# Patient Record
Sex: Female | Born: 1995 | Hispanic: No | Marital: Single | Smoking: Never smoker
Health system: Southern US, Community
[De-identification: ages and names within clinical notes are randomized; demographics above are authoritative.]

## PROBLEM LIST (undated history)

## (undated) DIAGNOSIS — N750 Cyst of Bartholin's gland: Secondary | ICD-10-CM

## (undated) HISTORY — PX: DENTAL SURGERY: SHX609

---

## 2015-06-20 ENCOUNTER — Emergency Department (INDEPENDENT_AMBULATORY_CARE_PROVIDER_SITE_OTHER)
Admission: EM | Admit: 2015-06-20 | Discharge: 2015-06-20 | Disposition: A | Discharge status: Home / Self Care | Source: Home / Self Care | Attending: Emergency Medicine | Admitting: Emergency Medicine

## 2015-06-20 ENCOUNTER — Encounter (HOSPITAL_COMMUNITY): Payer: Self-pay | Admitting: *Deleted

## 2015-06-20 ENCOUNTER — Other Ambulatory Visit (HOSPITAL_COMMUNITY)
Admission: RE | Admit: 2015-06-20 | Discharge: 2015-06-20 | Disposition: A | Discharge status: Home / Self Care | Source: Ambulatory Visit | Attending: Emergency Medicine | Admitting: Emergency Medicine

## 2015-06-20 DIAGNOSIS — N751 Abscess of Bartholin's gland: Secondary | ICD-10-CM

## 2015-06-20 DIAGNOSIS — N750 Cyst of Bartholin's gland: Secondary | ICD-10-CM | POA: Insufficient documentation

## 2015-06-20 MED ORDER — TRAMADOL HCL 50 MG PO TABS: 50.0000 mg | ORAL_TABLET | Freq: Four times a day (QID) | ORAL | Status: DC | PRN

## 2015-06-20 MED ORDER — IBUPROFEN 800 MG PO TABS: 800.0000 mg | ORAL_TABLET | Freq: Three times a day (TID) | ORAL | Status: DC

## 2015-06-20 MED ORDER — SULFAMETHOXAZOLE-TRIMETHOPRIM 800-160 MG PO TABS: 2.0000 | ORAL_TABLET | Freq: Two times a day (BID) | ORAL | Status: DC

## 2015-06-20 NOTE — ED Provider Notes (Signed)
HPI  SUBJECTIVE:  Teresa Braun is a 20 y.o. female who presents with painful swelling of her right labia starting yesterday. 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 Was treated for this last year with a Ward catheter and Bactrim, and improved. She states that it started draining earlier today, and that it has decreased in size somewhat. Symptoms better with warm compresses, worse with palpation, movement, walking. She has also tried sitz baths. She has not taken any medications for this. No itching, burning, nausea, vomiting, fevers, vaginal bleeding, vaginal discharge, genital rash. She is in a monogamous sexual relationship with a female, who is asymptomatic. STDs are not a concern today. Past medical history negative for gonorrhea, chlamydia, HIV, HSV, syphilis, BV, Trichomonas, yeast, diabetes. LMP last week, denies possibility of being pregnant. PMD: None.    History reviewed. No pertinent past medical history.  History reviewed. No pertinent past surgical history.  History reviewed. No pertinent family history.  Social History  Substance Use Topics  . Smoking status: Never Smoker   . Smokeless tobacco: None  . Alcohol Use: No    No current facility-administered medications for this encounter.  Current outpatient prescriptions:  .  ibuprofen (ADVIL,MOTRIN) 800 MG tablet, Take 1 tablet (800 mg total) by mouth 3 (three) times daily., Disp: 30 tablet, Rfl: 0 .  sulfamethoxazole-trimethoprim (BACTRIM DS,SEPTRA DS) 800-160 MG tablet, Take 2 tablets by mouth 2 (two) times daily., Disp: 40 tablet, Rfl: 0 .  traMADol (ULTRAM) 50 MG tablet, Take 1 tablet (50 mg total) by mouth every 6 (six) hours as needed., Disp: 15 tablet, Rfl: 0  No Known Allergies   ROS  As noted in HPI.   Physical Exam  BP 113/78 mmHg  Pulse 82  Temp(Src) 98 F (36.7 C) (Oral)  Resp 16  SpO2 99%  LMP  05/29/2015  Constitutional: Well developed, well nourished, no acute distress Eyes:  EOMI, conjunctiva normal bilaterally HENT: Normocephalic, atraumatic,mucus membranes moist Respiratory: Normal inspiratory effort Cardiovascular: Normal rate GI: nondistended GU: Tender swelling inferior medial right labia consistent with a Bartholin's gland abscess. Positive expressible yellowish clear drainage. No other genital rash, vaginal discharge. Patient declined chaperone. skin: No rash, skin intact Musculoskeletal: no deformities Neurologic: Alert & oriented x 3, no focal neuro deficits Psychiatric: Speech and behavior appropriate   ED Course   Medications - No data to display  Orders Placed This Encounter  Procedures  . Culture, routine-abscess    Right labia/bartholin gland    Standing Status: Standing     Number of Occurrences: 1     Standing Expiration Date:     Order Specific Question:  Patient immune status    Answer:  Normal    No results found for this or any previous visit (from the past 24 hour(s)). No results found.  ED Clinical Impression  Bartholin gland cyst  Bartholin's gland abscess   ED Assessment/Plan  Is already draining on exam, will not I&D today. patient states that it has become significantly smaller since this morning. Obtained some drainage for culture to confirm antibiotic choice. We'll have her continue warm sitz baths, warm compresses, home with Bactrim, patient states that this has worked well for her in the past, pain medicine, OB/GYN referral. Discussed medical decision-making, plan and follow-up, patient agrees with plan.  *This clinic note was created using Dragon dictation software. Therefore, there may be occasional mistakes despite careful proofreading.  ?  Domenick Gong, MD 06/20/15 2100

## 2015-06-20 NOTE — Discharge Instructions (Signed)
Follow-up with one of the OB/GYN providers listed to have this definitively taken care of. Continue sitz baths, warm compresses. Make sure you finish all of the antibiotics. Take the pain medicine as needed for pain. Return to the ED for fevers above 100.4, pain not controlled medications or other concerns

## 2015-06-20 NOTE — ED Notes (Signed)
Pt states  She  Has  A  Recurring  Bartholin  Cyst     That  Has  Become  Inflamed    X  2  Days     She reports          She  Has  Had  Numerous    Cysts  Before       she  Reports  The  Symptoms    Not  releived  By warm compresses   And  sitzbaths

## 2015-06-23 LAB — CULTURE, ROUTINE-ABSCESS
CULTURE: NO GROWTH
GRAM STAIN: NONE SEEN

## 2015-06-28 ENCOUNTER — Telehealth (HOSPITAL_COMMUNITY): Payer: Self-pay | Admitting: Emergency Medicine

## 2015-06-28 NOTE — ED Notes (Signed)
Called pt and notified of recent lab results from visit 3/10 Pt ID'd properly... Reports feeling better and sx have subsided  Per Dr. Dayton ScrapeMurray,  Please let patient know that abscess culture is negative for MRSA. Finish rx bactrim (trimethoprim/sulfamethoxazole). Recheck for increasing redness/swelling/drainage/pain. LM  Adv pt if sx are not getting better to return  Pt verb understanding

## 2015-10-11 ENCOUNTER — Emergency Department (HOSPITAL_COMMUNITY)
Admission: EM | Admit: 2015-10-11 | Discharge: 2015-10-11 | Disposition: A | Discharge status: Home / Self Care | Attending: Emergency Medicine | Admitting: Emergency Medicine

## 2015-10-11 ENCOUNTER — Encounter (HOSPITAL_COMMUNITY): Payer: Self-pay | Admitting: Emergency Medicine

## 2015-10-11 DIAGNOSIS — R102 Pelvic and perineal pain: Secondary | ICD-10-CM | POA: Diagnosis present

## 2015-10-11 DIAGNOSIS — N751 Abscess of Bartholin's gland: Secondary | ICD-10-CM | POA: Insufficient documentation

## 2015-10-11 DIAGNOSIS — Z792 Long term (current) use of antibiotics: Secondary | ICD-10-CM | POA: Insufficient documentation

## 2015-10-11 HISTORY — DX: Cyst of Bartholin's gland: N75.0

## 2015-10-11 LAB — POC URINE PREG, ED: PREG TEST UR: NEGATIVE

## 2015-10-11 LAB — URINE MICROSCOPIC-ADD ON

## 2015-10-11 LAB — URINALYSIS, ROUTINE W REFLEX MICROSCOPIC
BILIRUBIN URINE: NEGATIVE
Glucose, UA: NEGATIVE mg/dL
KETONES UR: NEGATIVE mg/dL
NITRITE: NEGATIVE
Protein, ur: NEGATIVE mg/dL
SPECIFIC GRAVITY, URINE: 1.01 (ref 1.005–1.030)
pH: 8 (ref 5.0–8.0)

## 2015-10-11 LAB — COMPREHENSIVE METABOLIC PANEL
ALK PHOS: 55 U/L (ref 38–126)
ALT: 16 U/L (ref 14–54)
ANION GAP: 8 (ref 5–15)
AST: 21 U/L (ref 15–41)
Albumin: 4.5 g/dL (ref 3.5–5.0)
BILIRUBIN TOTAL: 0.7 mg/dL (ref 0.3–1.2)
BUN: 7 mg/dL (ref 6–20)
CALCIUM: 9.1 mg/dL (ref 8.9–10.3)
CO2: 24 mmol/L (ref 22–32)
Chloride: 105 mmol/L (ref 101–111)
Creatinine, Ser: 0.72 mg/dL (ref 0.44–1.00)
GLUCOSE: 103 mg/dL — AB (ref 65–99)
Potassium: 4.1 mmol/L (ref 3.5–5.1)
Sodium: 137 mmol/L (ref 135–145)
TOTAL PROTEIN: 7.7 g/dL (ref 6.5–8.1)

## 2015-10-11 LAB — LIPASE, BLOOD: Lipase: 29 U/L (ref 11–51)

## 2015-10-11 LAB — CBC
HCT: 38 % (ref 36.0–46.0)
HEMOGLOBIN: 13 g/dL (ref 12.0–15.0)
MCH: 29.5 pg (ref 26.0–34.0)
MCHC: 34.2 g/dL (ref 30.0–36.0)
MCV: 86.4 fL (ref 78.0–100.0)
Platelets: 137 10*3/uL — ABNORMAL LOW (ref 150–400)
RBC: 4.4 MIL/uL (ref 3.87–5.11)
RDW: 12.9 % (ref 11.5–15.5)
WBC: 12.8 10*3/uL — ABNORMAL HIGH (ref 4.0–10.5)

## 2015-10-11 MED ORDER — ONDANSETRON 4 MG PO TBDP: 4.0000 mg | ORAL_TABLET | Freq: Three times a day (TID) | ORAL | Status: DC | PRN

## 2015-10-11 MED ORDER — DOXYCYCLINE HYCLATE 100 MG PO CAPS: 100.0000 mg | ORAL_CAPSULE | Freq: Two times a day (BID) | ORAL | Status: DC

## 2015-10-11 MED ORDER — DOXYCYCLINE HYCLATE 100 MG PO TABS
100.0000 mg | ORAL_TABLET | Freq: Once | ORAL | Status: AC
  Administered 2015-10-11: 100 mg via ORAL
  Filled 2015-10-11: qty 1

## 2015-10-11 NOTE — Discharge Instructions (Signed)
Bartholin Cyst or Abscess A Bartholin cyst is a fluid-filled sac that forms on a Bartholin gland. Bartholin glands are small glands that are located within the folds of skin (labia) along the sides of the lower opening of the vagina. These glands produce a fluid to moisten the outside of the vagina during sexual intercourse. A Bartholin cyst causes a bulge on the side of the vagina. A cyst that is not large or infected may not cause symptoms or problems. However, if the fluid within the cyst becomes infected, the cyst can turn into an abscess. An abscess may cause discomfort or pain. CAUSES A Bartholin cyst may develop when the duct of the gland becomes blocked. In many cases, the cause of this is not known. Various kinds of bacteria can cause the cyst to become infected and develop into an abscess. RISK FACTORS You may be at an increased risk of developing a Bartholin cyst or abscess if:  You are a woman of reproductive age.  You have a history of previous Bartholin cysts or abscesses.  You have diabetes.  You have a sexually transmitted disease (STD). SIGNS AND SYMPTOMS The severity of symptoms varies depending on the size of the cyst and whether it is infected. Symptoms may include:  A bulge or swelling near the lower opening of your vagina.  Discomfort or pain.  Redness.  Pain during sexual intercourse.  Pain when walking.  Fluid draining from the area. DIAGNOSIS Your health care provider may make a diagnosis based on your symptoms and a physical exam. He or she will look for swelling in your vaginal area. Blood tests may be done to check for infections. A sample of fluid from the cyst or abscess may also be taken to be tested in a lab. TREATMENT Small cysts that are not infected may not require any treatment. These often go away on their own. Yourhealth care provider will recommend hot baths and the use of warm compresses. These may also be part of the treatment for an abscess.  Treatment options for a large cyst or abscess may include:   Antibiotic medicine.  A surgical procedure to drain the abscess. One of the following procedures may be done:  Incision and drainage. An incision is made in the cyst or abscess so that the fluid drains out. A catheter may be placed inside the cyst so that it does not close and fill up with fluid again. The catheter will be removed after you have a follow-up visit with a specialist (gynecologist).  Marsupialization. The cyst or abscess is opened and kept open by stitching the edges of the skin to the walls of the cyst or abscess. This allows it to continue to drain and not fill up with fluid again. If you have cysts or abscesses that keep returning and have required incision and drainage multiple times, your health care provider may talk to you about surgery to remove the Bartholin gland. HOME CARE INSTRUCTIONS  Take medicines only as directed by your health care provider.  If you were prescribed an antibiotic medicine, finish it all even if you start to feel better.  Apply warm, wet compresses to the area or take warm, shallow baths that cover your pelvic region (sitz baths) several times a day or as directed by your health care provider.  Do not squeeze the cyst or apply heavy pressure to it.  Do not have sexual intercourse until the cyst has gone away.  If your cyst or abscess was   opened, a small piece of gauze or a drain may have been placed in the area to allow drainage. Do not remove the gauze or the drain until directed by your health care provider.  Wear feminine pads--not tampons--as needed for any drainage or bleeding.  Keep all follow-up visits as directed by your health care provider. This is important. PREVENTION Take these steps to help prevent a Bartholin cyst from returning:  Practice good hygiene.   Clean your vaginal area with mild soap and a soft cloth when you bathe.  Practice safe sex to prevent  STDs. SEEK MEDICAL CARE IF:  You have increased pain, swelling, or redness in the area of the cyst.  Puslike drainage is coming from the cyst.  You have a fever.   This information is not intended to replace advice given to you by your health care provider. Make sure you discuss any questions you have with your health care provider.   Document Released: 03/29/2005 Document Revised: 04/19/2014 Document Reviewed: 11/12/2013 Elsevier Interactive Patient Education 2016 [employer]  

## 2015-10-11 NOTE — ED Provider Notes (Signed)
CSN: [id number]     Arrival date & time 10/11/15  0225 History   First MD Initiated Contact with Patient 10/11/15 0430     Chief Complaint  Patient presents with  . Bartholin's Cyst  . Nausea      HPI  Patient presents for evaluation of "a Bartholin's cyst". States she's had multiple cysts in the past. She's had incision and drainage on some. Some she has handled at home with warm compresses and massage. States that her symptoms started yesterday. She had to work today. He more acutely painful tonight at her job. 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 Sedation episode where was painful after she went from dealing to standing and she felt lightheaded and nauseated. Suddenly she had a vagal episode. This is resolved. She states that her abscesses at a point now where she is comfort with it at home. However she states "it does better when I get antibiotics. She strongly prefers to not undergo incision and drainage.  Past Medical History  Diagnosis Date  . Bartholin cyst    History reviewed. No pertinent past surgical history. No family history on file. Social History  Substance Use Topics  . Smoking status: Never Smoker   . Smokeless tobacco: None  . Alcohol Use: No   OB History    No data available     Review of Systems  Constitutional: Negative for fever, chills, diaphoresis, appetite change and fatigue.  HENT: Negative for mouth sores, sore throat and trouble swallowing.   Eyes: Negative for visual disturbance.  Respiratory: Negative for cough, chest tightness, shortness of breath and wheezing.   Cardiovascular: Negative for chest pain.  Gastrointestinal: Negative for nausea, vomiting, abdominal pain, diarrhea and abdominal distention.  Endocrine: Negative for polydipsia, polyphagia and polyuria.  Genitourinary: Positive for vaginal pain. Negative for dysuria, frequency and hematuria.  Musculoskeletal: Negative for gait problem.  Skin: Negative for color change, pallor and rash.    Neurological: Negative for dizziness, syncope, light-headedness and headaches.  Hematological: Does not bruise/bleed easily.  Psychiatric/Behavioral: Negative for behavioral problems and confusion.      Allergies  Review of patient's allergies indicates no known allergies.  Home Medications   Prior to Admission medications   Medication Sig Start Date End Date Taking? Authorizing Provider  sulfamethoxazole-trimethoprim (BACTRIM DS,SEPTRA DS) 800-160 MG tablet Take 2 tablets by mouth 2 (two) times daily. Patient taking differently: Take 1 tablet by mouth once.  06/20/15  Yes Domenick GongAshley Mortenson, MD  doxycycline (VIBRAMYCIN) 100 MG capsule Take 1 capsule (100 mg total) by mouth 2 (two) times daily. 10/11/15   Rolland PorterMark Silviano Neuser, MD  ibuprofen (ADVIL,MOTRIN) 800 MG tablet Take 1 tablet (800 mg total) by mouth 3 (three) times daily. Patient not taking: Reported on 10/11/2015 06/20/15   Domenick GongAshley Mortenson, MD  ondansetron (ZOFRAN ODT) 4 MG disintegrating tablet Take 1 tablet (4 mg total) by mouth every 8 (eight) hours as needed for nausea. 10/11/15   Rolland PorterMark Quadasia Newsham, MD  traMADol (ULTRAM) 50 MG tablet Take 1 tablet (50 mg total) by mouth every 6 (six) hours as needed. Patient not taking: Reported on 10/11/2015 06/20/15   Domenick GongAshley Mortenson, MD   BP 107/59 mmHg  Pulse 68  Temp(Src) 99 F (37.2 C) (Oral)  Resp 19  Ht 5\' 5"  (1.651 m)  Wt 129 lb (58.514 kg)  BMI 21.47 kg/m2  SpO2 100% Physical Exam  Constitutional: She is oriented to person, place, and time. She appears well-developed and well-nourished. No distress.  HENT:  Head: Normocephalic.  Eyes: Conjunctivae are normal. Pupils are equal, round, and reactive to light. No scleral icterus.  Neck: Normal range of motion. Neck supple. No thyromegaly present.  Cardiovascular: Normal rate and regular rhythm.  Exam reveals no gallop and no friction rub.   No murmur heard. Pulmonary/Chest: Effort normal and breath sounds normal. No respiratory distress. She has no  wheezes. She has no rales.  Abdominal: Soft. Bowel sounds are normal. She exhibits no distension. There is no tenderness. There is no rebound.  Genitourinary:     Musculoskeletal: Normal range of motion.  Neurological: She is alert and oriented to person, place, and time.  Skin: Skin is warm and dry. No rash noted.  Psychiatric: She has a normal mood and affect. Her behavior is normal.    ED Course  Procedures (including critical care time) Labs Review Labs Reviewed  COMPREHENSIVE METABOLIC PANEL - Abnormal; Notable for the following:    Glucose, Bld 103 (*)    All other components within normal limits  CBC - Abnormal; Notable for the following:    WBC 12.8 (*)    Platelets 137 (*)    All other components within normal limits  URINALYSIS, ROUTINE W REFLEX MICROSCOPIC (NOT AT [facility]) - Abnormal; Notable for the following:    Hgb urine dipstick LARGE (*)    Leukocytes, UA TRACE (*)    All other components within normal limits  URINE MICROSCOPIC-ADD ON - Abnormal; Notable for the following:    Squamous Epithelial / LPF 6-30 (*)    Bacteria, UA RARE (*)    All other components within normal limits  LIPASE, BLOOD  POC URINE PREG, ED    Imaging Review No results found. I have personally reviewed and evaluated these images and lab results as part of my medical decision-making.   EKG Interpretation None      MDM   Final diagnoses:  Bartholin's gland abscess    Pt states that she would prefer NOT to have I&D.  She would prefer warm compresses , abx, and return if not draining as it has in the past.    Rolland PorterMark Jahnia Hewes, MD 10/11/15 [phone]

## 2015-10-11 NOTE — ED Notes (Signed)
Pt states that she has a bartholin cyst. Pt states she also has sudden onset nausea that hit her when she was leaving work to come here. Pt denies emesis, but states she had 1 episode of diarrhea yesterday. Pt states that she is currently on her period and is having abnormally heavy bleeding.

## 2015-12-19 ENCOUNTER — Encounter (HOSPITAL_COMMUNITY): Payer: Self-pay | Admitting: Emergency Medicine

## 2015-12-19 ENCOUNTER — Emergency Department (HOSPITAL_COMMUNITY)

## 2015-12-19 ENCOUNTER — Emergency Department (HOSPITAL_COMMUNITY)
Admission: EM | Admit: 2015-12-19 | Discharge: 2015-12-19 | Disposition: A | Discharge status: Home / Self Care | Attending: Emergency Medicine | Admitting: Emergency Medicine

## 2015-12-19 DIAGNOSIS — M79672 Pain in left foot: Secondary | ICD-10-CM | POA: Insufficient documentation

## 2015-12-19 DIAGNOSIS — Z791 Long term (current) use of non-steroidal anti-inflammatories (NSAID): Secondary | ICD-10-CM | POA: Insufficient documentation

## 2015-12-19 DIAGNOSIS — Z79899 Other long term (current) drug therapy: Secondary | ICD-10-CM | POA: Insufficient documentation

## 2015-12-19 MED ORDER — CEPHALEXIN 500 MG PO CAPS: 500.0000 mg | ORAL_CAPSULE | Freq: Four times a day (QID) | ORAL | 0 refills | Status: DC

## 2015-12-19 MED ORDER — CEPHALEXIN 250 MG/5ML PO SUSR: 500.0000 mg | Freq: Four times a day (QID) | ORAL | 0 refills | Status: AC

## 2015-12-19 NOTE — ED Provider Notes (Signed)
[facility]-EMERGENCY DEPT Provider Note   CSN: [id number] Arrival date & time: 12/19/15  0944     History   Chief Complaint Chief Complaint  Patient presents with  . Foot Pain    HPI Teresa Braun is a 20 y.o. female.  20 year old Caucasian female in no significant past medical history presents to the ED this morning with left foot pain. Patient hasn't pain started last night when she woke up. She worked last night and was on her feet the entire night. She describes the pain as an aching sensation. Patient also endorses noticing a knot on the top of her left foot. She denies any injury. Moving makes the pain worse. She has tried nothing for the pain. Denies any insect bite. Denies any leg swelling or pain. Able to ambulate. Denies any fever, chills, headache, chest pain, shortness of breath, abdominal pain, nausea, vomiting.      Past Medical History:  Diagnosis Date  . Bartholin cyst     There are no active problems to display for this patient.   History reviewed. No pertinent surgical history.  OB History    No data available       Home Medications    Prior to Admission medications   Medication Sig Start Date End Date Taking? Authorizing Provider  doxycycline (VIBRAMYCIN) 100 MG capsule Take 1 capsule (100 mg total) by mouth 2 (two) times daily. 10/11/15   Rolland PorterMark James, MD  ibuprofen (ADVIL,MOTRIN) 800 MG tablet Take 1 tablet (800 mg total) by mouth 3 (three) times daily. Patient not taking: Reported on 10/11/2015 06/20/15   Domenick GongAshley Mortenson, MD  ondansetron (ZOFRAN ODT) 4 MG disintegrating tablet Take 1 tablet (4 mg total) by mouth every 8 (eight) hours as needed for nausea. 10/11/15   Rolland PorterMark James, MD  sulfamethoxazole-trimethoprim (BACTRIM DS,SEPTRA DS) 800-160 MG tablet Take 2 tablets by mouth 2 (two) times daily. Patient taking differently: Take 1 tablet by mouth once.  06/20/15   Domenick GongAshley Mortenson, MD  traMADol (ULTRAM) 50 MG tablet Take 1 tablet (50 mg total) by mouth every  6 (six) hours as needed. Patient not taking: Reported on 10/11/2015 06/20/15   Domenick GongAshley Mortenson, MD    Family History No family history on file.  Social History Social History  Substance Use Topics  . Smoking status: Never Smoker  . Smokeless tobacco: Never Used  . Alcohol use Yes     Comment: Pt only drinks 1-2 alcoholic beverages a month     Allergies   Review of patient's allergies indicates no known allergies.   Review of Systems Review of Systems  Constitutional: Negative for chills and fever.  HENT: Negative for congestion, ear pain and sore throat.   Eyes: Negative for pain and visual disturbance.  Respiratory: Negative for cough and shortness of breath.   Cardiovascular: Negative for chest pain and palpitations.  Gastrointestinal: Negative for abdominal pain, nausea and vomiting.  Genitourinary: Negative for dysuria and hematuria.  Musculoskeletal: Negative for arthralgias and back pain.  Skin: Negative for color change and rash.  Neurological: Negative for dizziness, syncope, weakness and headaches.  All other systems reviewed and are negative.    Physical Exam Updated Vital Signs BP 101/58 (BP Location: Left Arm)   Pulse 70   Temp 98.1 F (36.7 C) (Oral)   Ht 5\' 6"  (1.676 m)   Wt 58.1 kg   LMP 11/30/2015   SpO2 100%   BMI 20.66 kg/m   Physical Exam  Constitutional: She appears well-developed and  well-nourished. No distress.  Eyes: Right eye exhibits no discharge. Left eye exhibits no discharge. No scleral icterus.  Pulmonary/Chest: No respiratory distress.  Musculoskeletal: Normal range of motion.  Area of induration to the top of left foot. That is ttp. No area of fluctuance noted. Mild erythema localized to top of foot.No streaking. Not warm to touch. Patient states the swelling has reduced. Full ROM. Sensation intact. DP pulses 2+ bilaterally.   Neurological: She is alert.  Skin: Skin is warm and dry. Capillary refill takes less than 2 seconds. No  pallor.  Nursing note and vitals reviewed.    ED Treatments / Results  Labs (all labs ordered are listed, but only abnormal results are displayed) Labs Reviewed - No data to display  EKG  EKG Interpretation None       Radiology Dg Foot Complete Left  Result Date: 12/19/2015 CLINICAL DATA:  Pain.  No known injury.  Initial evaluation . EXAM: LEFT FOOT - COMPLETE 3+ VIEW COMPARISON:  No recent prior. FINDINGS: No acute bony or joint abnormality identified. No evidence of fracture or dislocation. Tiny sclerotic focus in the distal aspect of the left fifth metacarpal. This is most likely a bone island. IMPRESSION: No acute abnormality. Electronically Signed   By: Maisie Fus  Register   On: 12/19/2015 11:11    Procedures Procedures (including critical care time)  Medications Ordered in ED Medications - No data to display   Initial Impression / Assessment and Plan / ED Course  I have reviewed the triage vital signs and the nursing notes.  Pertinent labs & imaging results that were available during my care of the patient were reviewed by me and considered in my medical decision making (see chart for details).  Clinical Course  Patient presented to the ED today with left foot pain. Patient denies any injuries or insect bites. X-ray of that was unremarkable. Likely swelling and pain from an insect bite. No signs of cellulitis at this time. Neurovascularly intact. Patient states that his swelling has gone down since when she noticed last night. Afebrile and not tachycardic. Area of edema marked. Discussed with patient to keep an eye on the area. If the redness or swelling increases or if she becomes febrile please return to the ED. Patient given a prescription for Keflex. Patient states she is not able swallow pills and wanted oral medicine. Discussed with pharmacy dosing for liquid Keflex. Instructed not to take them unless she notices worsening swelling as patient is going out of town with her  mother for a wedding. Patient instructed to rest, ice, elevate her left foot. Strict return cautions given. Patient denied wanting anything for pain. Hemodynamically stable. Discharged home in acute distress with stable vital signs.   Final Clinical Impressions(s) / ED Diagnoses   Final diagnoses:  Foot pain, left    New Prescriptions Discharge Medication List as of 12/19/2015 11:33 AM    START taking these medications   Details  cephALEXin (KEFLEX) 250 MG/5ML suspension Take 10 mLs (500 mg total) by mouth 4 (four) times daily., Starting Fri 12/19/2015, Until Wed 12/24/2015, Print         Rise Mu, PA-C 12/19/15 1533    Raeford Razor, MD 12/20/15 1113

## 2015-12-19 NOTE — Discharge Instructions (Signed)
Take ibuprofen and tylenol as needed for pain. Rest, ice and elevated foot. If you notice the redness and swelling gets worse my take antibiotic or return to the ED. Follow up with primary care doctor if symptoms worsen.

## 2015-12-19 NOTE — ED Triage Notes (Signed)
Pt presents to ED with complaints of left foot pain which began last night. Pt states that she noticed a severe aching pain in her left foot last night before going into work. She says she also noticed a knot on top of the left foot around 9pm. Pt states the pain is worse and denies any injury that could have caused this. Currently rating pain 6/10.

## 2017-01-07 ENCOUNTER — Emergency Department (HOSPITAL_COMMUNITY)
Admission: EM | Admit: 2017-01-07 | Discharge: 2017-01-07 | Disposition: A | Discharge status: Other Acute Inpatient Hospital | Payer: BLUE CROSS/BLUE SHIELD | Attending: Emergency Medicine | Admitting: Emergency Medicine

## 2017-01-07 ENCOUNTER — Encounter (HOSPITAL_COMMUNITY): Payer: Self-pay | Admitting: *Deleted

## 2017-01-07 ENCOUNTER — Encounter (HOSPITAL_COMMUNITY): Payer: Self-pay

## 2017-01-07 ENCOUNTER — Inpatient Hospital Stay (HOSPITAL_COMMUNITY)
Admission: AD | Admit: 2017-01-07 | Discharge: 2017-01-10 | DRG: 883 | Disposition: A | Discharge status: Home / Self Care | Payer: BLUE CROSS/BLUE SHIELD | Source: Intra-hospital | Attending: Psychiatry | Admitting: Psychiatry

## 2017-01-07 DIAGNOSIS — F332 Major depressive disorder, recurrent severe without psychotic features: Secondary | ICD-10-CM | POA: Diagnosis present

## 2017-01-07 DIAGNOSIS — R45851 Suicidal ideations: Secondary | ICD-10-CM | POA: Diagnosis not present

## 2017-01-07 DIAGNOSIS — F1290 Cannabis use, unspecified, uncomplicated: Secondary | ICD-10-CM | POA: Diagnosis not present

## 2017-01-07 DIAGNOSIS — F39 Unspecified mood [affective] disorder: Secondary | ICD-10-CM | POA: Diagnosis not present

## 2017-01-07 DIAGNOSIS — R413 Other amnesia: Secondary | ICD-10-CM | POA: Diagnosis not present

## 2017-01-07 DIAGNOSIS — Z046 Encounter for general psychiatric examination, requested by authority: Secondary | ICD-10-CM | POA: Diagnosis present

## 2017-01-07 DIAGNOSIS — Z9889 Other specified postprocedural states: Secondary | ICD-10-CM | POA: Diagnosis not present

## 2017-01-07 DIAGNOSIS — F603 Borderline personality disorder: Principal | ICD-10-CM | POA: Diagnosis present

## 2017-01-07 DIAGNOSIS — R443 Hallucinations, unspecified: Secondary | ICD-10-CM | POA: Diagnosis not present

## 2017-01-07 DIAGNOSIS — R4587 Impulsiveness: Secondary | ICD-10-CM | POA: Diagnosis not present

## 2017-01-07 DIAGNOSIS — F419 Anxiety disorder, unspecified: Secondary | ICD-10-CM | POA: Diagnosis not present

## 2017-01-07 DIAGNOSIS — G4700 Insomnia, unspecified: Secondary | ICD-10-CM | POA: Diagnosis not present

## 2017-01-07 LAB — CBC
HEMATOCRIT: 39.4 % (ref 36.0–46.0)
HEMOGLOBIN: 13.2 g/dL (ref 12.0–15.0)
MCH: 29.1 pg (ref 26.0–34.0)
MCHC: 33.5 g/dL (ref 30.0–36.0)
MCV: 86.8 fL (ref 78.0–100.0)
Platelets: 139 10*3/uL — ABNORMAL LOW (ref 150–400)
RBC: 4.54 MIL/uL (ref 3.87–5.11)
RDW: 13 % (ref 11.5–15.5)
WBC: 5.6 10*3/uL (ref 4.0–10.5)

## 2017-01-07 LAB — SALICYLATE LEVEL

## 2017-01-07 LAB — COMPREHENSIVE METABOLIC PANEL
ALT: 17 U/L (ref 14–54)
ANION GAP: 7 (ref 5–15)
AST: 24 U/L (ref 15–41)
Albumin: 4.3 g/dL (ref 3.5–5.0)
Alkaline Phosphatase: 44 U/L (ref 38–126)
BUN: 7 mg/dL (ref 6–20)
CHLORIDE: 107 mmol/L (ref 101–111)
CO2: 25 mmol/L (ref 22–32)
CREATININE: 0.7 mg/dL (ref 0.44–1.00)
Calcium: 9.1 mg/dL (ref 8.9–10.3)
Glucose, Bld: 99 mg/dL (ref 65–99)
Potassium: 3.7 mmol/L (ref 3.5–5.1)
SODIUM: 139 mmol/L (ref 135–145)
Total Bilirubin: 0.6 mg/dL (ref 0.3–1.2)
Total Protein: 7.7 g/dL (ref 6.5–8.1)

## 2017-01-07 LAB — RAPID URINE DRUG SCREEN, HOSP PERFORMED
Amphetamines: NOT DETECTED
BARBITURATES: NOT DETECTED
BENZODIAZEPINES: NOT DETECTED
COCAINE: NOT DETECTED
Opiates: NOT DETECTED
TETRAHYDROCANNABINOL: NOT DETECTED

## 2017-01-07 LAB — I-STAT BETA HCG BLOOD, ED (MC, WL, AP ONLY): I-stat hCG, quantitative: <5 m[IU]/mL (ref <5)

## 2017-01-07 LAB — ETHANOL

## 2017-01-07 LAB — ACETAMINOPHEN LEVEL: Acetaminophen (Tylenol), Serum: <10 ug/mL — ABNORMAL LOW (ref 10–30)

## 2017-01-07 MED ORDER — ALUM & MAG HYDROXIDE-SIMETH 200-200-20 MG/5ML PO SUSP: 30.0000 mL | ORAL | Status: DC | PRN

## 2017-01-07 MED ORDER — MAGNESIUM HYDROXIDE 400 MG/5ML PO SUSP: 30.0000 mL | Freq: Every day | ORAL | Status: DC | PRN

## 2017-01-07 MED ORDER — ONDANSETRON HCL 4 MG PO TABS: 4.0000 mg | ORAL_TABLET | Freq: Three times a day (TID) | ORAL | Status: DC | PRN

## 2017-01-07 MED ORDER — TRAZODONE HCL 50 MG PO TABS: 50.0000 mg | ORAL_TABLET | Freq: Every evening | ORAL | Status: DC | PRN

## 2017-01-07 MED ORDER — ZOLPIDEM TARTRATE 5 MG PO TABS: 5.0000 mg | ORAL_TABLET | Freq: Every evening | ORAL | Status: DC | PRN

## 2017-01-07 MED ORDER — HYDROXYZINE HCL 25 MG PO TABS: 25.0000 mg | ORAL_TABLET | Freq: Four times a day (QID) | ORAL | Status: DC | PRN

## 2017-01-07 MED ORDER — ACETAMINOPHEN 325 MG PO TABS: 650.0000 mg | ORAL_TABLET | ORAL | Status: DC | PRN

## 2017-01-07 NOTE — ED Notes (Signed)
Bed: [facility] Expected date:  Expected time:  Means of arrival:  Comments: Triage 4 

## 2017-01-07 NOTE — ED Triage Notes (Signed)
Patient is a Consulting civil engineer at [employer]. Patient reports having suicidal thoughts. Patient states that she has 3 plans- 1-to cut herself, 2-hang herself, and 3-to overdose and lay on the train tracks [place] her [place]. Patient denies HI. Patient states at night she is wide awake and sees images-the shadow people, naked lady, etc. Patient states recreational use of marijuana since the summer. Patient states she last drank alcohol 2 days ago.

## 2017-01-07 NOTE — ED Notes (Signed)
Introduced self to patient. Pt oriented to unit expectations.  Assessed pt for:  A) Anxiety &/or agitation: On admission to the [facility] pt is calm, cooperative, but irritable and disgruntled with being here. She has agreed to go for treatment voluntarily, but would rather be discharged.   S) Safety: Safety maintained with q-15-minute checks and hourly rounds by staff.  A) ADLs: Pt able to perform ADLs independently.  P) Pick-Up (room cleanliness): Pt's room clean and free of clutter.

## 2017-01-07 NOTE — [facility] Assessment (Addendum)
[facility] Assessment Progress Note    Case was staffed with Shaune Pollack DNP who recommended a inpatent admission. Patient has been accepted to [facility] 405-2 at 1600 hours.

## 2017-01-07 NOTE — BH Assessment (Signed)
[facility] Assessment Progress Note  Per Nanine Means, DNP, this pt requires psychiatric hospitalization at this time.  After speaking to Berneice Heinrich, RN, [facility] at [facility], she reports that a bed will be available for pt later today.  Pt has reluctantly signed Voluntary Admission and Consent for Treatment, as well as Consent to Release Information to a friend, and to the [facility], and a notification call has been placed to the latter.  Signed forms have been faxed to [facility].  Pt's nurse, Diane, has been notified, and agrees to send original paperwork along with pt via Juel Burrow, and to call report to [phone].  Doylene Canning, MA Triage Specialist [phone]

## 2017-01-07 NOTE — ED Notes (Signed)
Pt transported to [facility] by [facility]. All belongings returned to pt who signed for same.  

## 2017-01-07 NOTE — Progress Notes (Signed)
Teresa Braun is a 21 year old female pt admitted on voluntary basis. On admission, she reports that she went to her counselor at school and spoke about having self harm thoughts and then subsequently was brought to the ED. She denies any current SI on admission and is able to contract for safety while on the unit. She does endorse that she did seek out her counselor and has been having on-going depressive symptoms. She reports that she has been on medications but reports that she is not on any currently. She does report that is open to starting back on medications while she is here. She reports that she lives [place]-campus with some roommates and reports that she will go there after discharge. Teresa Braun was oriented to the unit and safety maintained.

## 2017-01-07 NOTE — Progress Notes (Signed)
D: Pt requested and signed her 72 hour form at 1815 today. Pt states that she just wants to leave. "I don't want to be here. Don't think I need to be here" . When asked about her suicidal thoughts Pt responded, "everyone thinks about suicide, not just me".  A) Brought the 72 hour form for Pt to sign. Provided Pt. With a 1:1. Given support and reassurance. R) Pt denies SI and HI.

## 2017-01-07 NOTE — ED Provider Notes (Signed)
[facility]-EMERGENCY DEPT Provider Note   CSN: [id number] Arrival date & time: 01/07/17  1018     History   Chief Complaint Chief Complaint  Patient presents with  . Medical Clearance  . Suicidal    HPI Teresa Braun is a 21 y.o. female.  She presents for evaluation of suicidal ideation.  She cites stressors from difficult relationships, which cause her to feel suicidal.  She has had thoughts of hurting herself by cutting, hanging, overdose, or lying on the railroad tracks.  Today she was at a therapist appointment, at the [facility], when after discussing her feelings, she was brought here for evaluation and treatment.  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  She has not been on any medications for depression or psychiatric illness for at least a year.  She denies recent fever, chills, cough, shortness of breath, weakness or dizziness.  She uses marijuana socially, but denies use of other illegal drugs, or problems with alcohol use.  There are no other known modifying factors.  HPI  Past Medical History:  Diagnosis Date  . Bartholin cyst     There are no active problems to display for this patient.   Past Surgical History:  Procedure Laterality Date  . DENTAL SURGERY      OB History    No data available       Home Medications    Prior to Admission medications   Medication Sig Start Date End Date Taking? Authorizing Provider  doxycycline (VIBRAMYCIN) 100 MG capsule Take 1 capsule (100 mg total) by mouth 2 (two) times daily. Patient not taking: Reported on 01/07/2017 10/11/15   Rolland Porter, MD  ibuprofen (ADVIL,MOTRIN) 800 MG tablet Take 1 tablet (800 mg total) by mouth 3 (three) times daily. Patient not taking: Reported on 10/11/2015 06/20/15   Domenick Gong, MD  ondansetron (ZOFRAN ODT) 4 MG disintegrating tablet  Take 1 tablet (4 mg total) by mouth every 8 (eight) hours as needed for nausea. Patient not taking: Reported on 01/07/2017 10/11/15   Rolland Porter, MD  sulfamethoxazole-trimethoprim (BACTRIM DS,SEPTRA DS) 800-160 MG tablet Take 2 tablets by mouth 2 (two) times daily. Patient not taking: Reported on 01/07/2017 06/20/15   Domenick Gong, MD  traMADol (ULTRAM) 50 MG tablet Take 1 tablet (50 mg total) by mouth every 6 (six) hours as needed. Patient not taking: Reported on 10/11/2015 06/20/15   Domenick Gong, MD    Family History Family History  Problem Relation Age of Onset  . Irritable bowel syndrome Mother     Social History Social History  Substance Use Topics  . Smoking status: Never Smoker  . Smokeless tobacco: Never Used  . Alcohol use Yes     Comment: drinks 1-2 times a week     Allergies   Patient has no known allergies.   Review of Systems Review of Systems  All other systems reviewed and are negative.    Physical Exam Updated Vital Signs BP 123/88 (BP Location: Left Arm)   Pulse 62   Temp 98.3 F (36.8 C) (Oral)   Resp 20   Ht  (1.676 m)   Wt 54 kg (119 lb)   LMP 01/07/2017   SpO2 100%   BMI 19.21 kg/m   Physical Exam  Constitutional: She is oriented to person,  place, and time. She appears well-developed and well-nourished. No distress.  HENT:  Head: Normocephalic and atraumatic.  Eyes: Pupils are equal, round, and reactive to light. Conjunctivae and EOM are normal.  Neck: Normal range of motion and phonation normal. Neck supple.  Cardiovascular: Normal rate.   Pulmonary/Chest: Effort normal.  Abdominal: There is guarding.  Musculoskeletal: Normal range of motion.  Neurological: She is alert and oriented to person, place, and time. She exhibits normal muscle tone.  Skin: Skin is warm and dry.  Psychiatric: She has a normal mood and affect. Her behavior is normal. Judgment and thought content normal.  Nursing note and vitals reviewed.    ED  Treatments / Results  Labs (all labs ordered are listed, but only abnormal results are displayed) Labs Reviewed  COMPREHENSIVE METABOLIC PANEL  ETHANOL  SALICYLATE LEVEL  ACETAMINOPHEN LEVEL  CBC  RAPID URINE DRUG SCREEN, HOSP PERFORMED  I-STAT BETA HCG BLOOD, ED ([facility], [facility], [facility] ONLY)    EKG  EKG Interpretation None       Radiology No results found.  Procedures Procedures (including critical care time)  Medications Ordered in ED Medications - No data to display   Initial Impression / Assessment and Plan / ED Course  I have reviewed the triage vital signs and the nursing notes.  Pertinent labs & imaging results that were available during my care of the patient were reviewed by me and considered in my medical decision making (see chart for details).  Clinical Course as of Jan 07 1245  Fri Jan 07, 2017  1242 At this time she is medically cleared for evaluation and treatment by psychiatry.  [EW]    Clinical Course User Index [EW] Mancel Bale, MD     Patient Vitals for the past 24 hrs:  BP Temp Temp src Pulse Resp SpO2 Height Weight  01/07/17 1208 123/88 98.3 F (36.8 C) Oral 62 20 100 %  (1.676 m) 54 kg (119 lb)    TTS consultation  Final Clinical Impressions(s) / ED Diagnoses   Final diagnoses:  Suicidal ideation   Suicidal ideation  with plan, associated with psychosocial stressors.  The patient is seeking help, and is not at risk for elopement at this time.   Nursing Notes Reviewed/ Care Coordinated Applicable Imaging Reviewed Interpretation of Laboratory Data incorporated into ED treatment   Plan-as per TTS in conjunction with oncoming provider team   New Prescriptions New Prescriptions   No medications on file     Mancel Bale, MD 01/10/17 1059

## 2017-01-07 NOTE — Tx Team (Signed)
Initial Treatment Plan 01/07/2017 5:56 PM Teresa Braun Cruise [id number]    PATIENT STRESSORS: Educational concerns Medication change or noncompliance   PATIENT STRENGTHS: Ability for insight Active sense of humor Average or above average intelligence Capable of independent living General fund of knowledge Supportive family/friends   PATIENT IDENTIFIED PROBLEMS: Depression Suicidal thoughts "Medications"                     DISCHARGE CRITERIA:  Ability to meet basic life and health needs Improved stabilization in mood, thinking, and/or behavior Verbal commitment to aftercare and medication compliance  PRELIMINARY DISCHARGE PLAN: Attend aftercare/continuing care group Return to previous living arrangement  PATIENT/FAMILY INVOLVEMENT: This treatment plan has been presented to and reviewed with the patient, Teresa Braun, and/or family member, .  The patient and family have been given the opportunity to ask questions and make suggestions.  Drucella Karbowski, [place], [place] 01/07/2017, 5:56 PM

## 2017-01-07 NOTE — BH Assessment (Addendum)
Assessment Note  Teresa Braun is an 21 y.o. female that presents this date from [facility] after meeting with a counselor and expressing thoughts off self harm with a plan to hang herself, cut herself or overdose. Patient reports having ongoing S/I for the last six months but denies any previous attempts.gestures. Patient does admit to one episode of cutting two weeks ago but would not elaborate on details. Patient denies any prior episodes of self harm. Patient denies receiving any current OP services but does report that she was involved with counseling/medication management at [facility] briefly (two months) last year in 2017. Patient states she "thinks" she was on a mood stabilizer through [facility] for two months but is unsure. Patient states she has never been diagnosed with any MH disorder but feels she suffers from depression with symptoms to include: decreased sleep (4 hours a night) for the last "few months" and excessive worrying over school and relationships. Patient admits to AVH stating she "sees and hears shadow people" but is vague in reference to details. Patient reports daily Cannabis use (up to 1 gram daily with last use on 01/06/17) and sporadic alcohol use stating she has "a couple drinks a week." Patient reports last use 2 days ago stating she had a drink." Patient is time/place oriented and denies any legal issues. Per notes, patient is a Consulting civil engineer at [employer]. Patient reports having suicidal thoughts. Patient states that she has 3 plans- 1-to cut herself, 2-hang herself, and 3-to overdose and lay on the train tracks by her house. Patient denies HI. Patient states at night she is wide awake and sees images-the shadow people, naked lady, etc. Patient states recreational use of marijuana since the summer. Patient states she last drank alcohol 2 days ago. Patient agrees to a voluntary admission to assist with diagnosis, stabilization and possible medication management. Case was staffed with Shaune Pollack DNP who  recommended a inpatent admission. Patient has been accepted to [facility] 405-2 at 1600 hours.    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 Bartholin cyst     Past Surgical History:  Procedure Laterality Date  . DENTAL SURGERY      Family History:  Family History  Problem Relation Age of Onset  . Irritable bowel syndrome Mother     Social History:  reports that she has never smoked. She has never used smokeless tobacco. She reports that she drinks alcohol. She reports that she uses drugs, including Marijuana.  Additional Social History:  Alcohol / Drug Use Pain Medications: See MAR Prescriptions: See MAR Over the Counter: See MAR History of alcohol / drug use?: Yes Longest period of sobriety (when/how long): Unknown Negative Consequences of Use:  (Denies) Withdrawal Symptoms:  (Denies) Substance #1 Name of Substance 1: Alcohol 1 - Age of First Use: 16 1 - Amount (size/oz): Amounts vary 1 - Frequency: Two to three times a week 1 - Duration: Last year 1 - Last Use / Amount: 01/04/17 "2 mixed drinks" Substance #2 Name of Substance 2: Cannabis 2 - Age of First Use: 18 2 - Amount (size/oz): 1 gram 2 - Frequency: daily 2 - Duration: Last year 2 - Last Use / Amount: 01/07/17 1/2 gram  CIWA: CIWA-Ar BP: 123/88 Pulse Rate: 62 COWS:    Allergies: No Known Allergies  Home Medications:  (Not in a hospital admission)  OB/GYN Status:  Patient's last menstrual period was 01/07/2017.  General  Assessment Data Location of Assessment: [facility] ED TTS Assessment: In system Is this a Tele or Face-to-Face Assessment?: Face-to-Face Is this an Initial Assessment or a Re-assessment for this encounter?: Initial Assessment Marital status: Single Maiden name: NA Is patient pregnant?: No Pregnancy Status: No Living Arrangements: Non-relatives/Friends Can pt return to current living  arrangement?: Yes Admission Status: Voluntary Is patient capable of signing voluntary admission?: Yes Referral Source: Other ([facility] referred) Insurance type: Tri care  Medical Screening Exam [facility] Walk-in ONLY) Medical Exam completed: Yes  Crisis Care Plan Living Arrangements: Non-relatives/Friends Legal Guardian:  (NA) Name of Psychiatrist: None Name of Therapist: None  Education Status Is patient currently in school?: Yes Current Grade: 3rd year college Highest grade of school patient has completed: 2 years college Name of school: [employer] person: NA  Risk to self with the past 6 months Suicidal Ideation: Yes-Currently Present Has patient been a risk to self within the past 6 months prior to admission? : Yes Suicidal Intent: Yes-Currently Present Has patient had any suicidal intent within the past 6 months prior to admission? : Yes Is patient at risk for suicide?: Yes Suicidal Plan?: Yes-Currently Present Has patient had any suicidal plan within the past 6 months prior to admission? : Yes Specify Current Suicidal Plan: Run in front of train, cutting or hanging Access to Means: Yes Specify Access to Suicidal Means: Pt states she could "find a way" What has been your use of drugs/alcohol within the last 12 months?: Current use Previous Attempts/Gestures: No How many times?: 0 Other Self Harm Risks: Hx of cutting Triggers for Past Attempts: Unknown Intentional Self Injurious Behavior: Cutting Comment - Self Injurious Behavior: Once two weeks ago Family Suicide History: No Recent stressful life event(s): Other (Comment) (School and relationship issues) Persecutory voices/beliefs?: No Depression: Yes Depression Symptoms:  (Pt is vague in reference to symptoms) Substance abuse history and/or treatment for substance abuse?: Yes Suicide prevention information given to non-admitted patients: Not applicable  Risk to Others within the past 6 months Homicidal Ideation:  No Does patient have any lifetime risk of violence toward others beyond the six months prior to admission? : No Thoughts of Harm to Others: No Current Homicidal Intent: No Current Homicidal Plan: No Access to Homicidal Means: No Identified Victim: NA History of harm to others?: No Assessment of Violence: None Noted Violent Behavior Description: NA Does patient have access to weapons?: No Criminal Charges Pending?: No Does patient have a court date: No Is patient on probation?: No  Psychosis Hallucinations: Auditory Delusions: None noted  Mental Status Report Appearance/Hygiene: In scrubs Eye Contact: Good Motor Activity: Freedom of movement Speech: Logical/coherent Level of Consciousness: Alert Mood: Anxious Affect: Appropriate to circumstance Anxiety Level: Moderate Thought Processes: Coherent, Relevant Judgement: Unimpaired Orientation: Person, Place, Time Obsessive Compulsive Thoughts/Behaviors: None  Cognitive Functioning Concentration: Normal Memory: Recent Intact, Remote Intact IQ: Average Insight: Fair Impulse Control: Fair Appetite: Fair Weight Loss: 0 Weight Gain: 0 Sleep: Decreased Total Hours of Sleep: 4 Vegetative Symptoms: None  ADLScreening [facility] Assessment Services) Patient's cognitive ability adequate to safely complete daily activities?: Yes Patient able to express need for assistance with ADLs?: Yes Independently performs ADLs?: Yes (appropriate for developmental age)  Prior Inpatient Therapy Prior Inpatient Therapy: No Prior Therapy Dates: NA Prior Therapy Facilty/Provider(s): NA Reason for Treatment: NA  Prior Outpatient Therapy Prior Outpatient Therapy: Yes Prior Therapy Dates: 2017 Prior Therapy Facilty/Provider(s): [facility] Reason for Treatment: Depression Does patient have an ACCT team?: No Does patient have Intensive In-House  Services?  : No Does patient have [facility] services? : No Does patient have P4CC services?: No  ADL  Screening (condition at time of admission) Patient's cognitive ability adequate to safely complete daily activities?: Yes Is the patient deaf or have difficulty hearing?: No Does the patient have difficulty seeing, even when wearing glasses/contacts?: No Does the patient have difficulty concentrating, remembering, or making decisions?: No Patient able to express need for assistance with ADLs?: Yes Does the patient have difficulty dressing or bathing?: No Independently performs ADLs?: Yes (appropriate for developmental age) Does the patient have difficulty walking or climbing stairs?: No Weakness of Legs: None Weakness of Arms/Hands: None  Home Assistive Devices/Equipment Home Assistive Devices/Equipment: None  Therapy Consults (therapy consults require a physician order) PT Evaluation Needed: No OT Evalulation Needed: No SLP Evaluation Needed: No Abuse/Neglect Assessment (Assessment to be complete while patient is alone) Physical Abuse: Denies Verbal Abuse: Denies Sexual Abuse: Denies Exploitation of patient/patient's resources: Denies Self-Neglect: Denies Values / Beliefs Cultural Requests During Hospitalization: None Spiritual Requests During Hospitalization: None Consults Spiritual Care Consult Needed: No Social Work Consult Needed: No Merchant navy officer (For Healthcare) Does Patient Have a Medical Advance Directive?: No Would patient like information on creating a medical advance directive?: No - Patient declined    Additional Information 1:1 In Past 12 Months?: No CIRT Risk: No Elopement Risk: No Does patient have medical clearance?: Yes     Disposition: Case was staffed with Shaune Pollack DNP who recommended a impatent admission. Patient has been accepted to [facility] 405-2 at 1600 hours.    Disposition Initial Assessment Completed for this Encounter: Yes Disposition of Patient: Inpatient treatment program Type of inpatient treatment program: Adult  On Site Evaluation by:    Reviewed with Physician:    Alfredia Ferguson 01/07/2017 3:32 PM

## 2017-01-07 NOTE — Progress Notes (Signed)
D.  Pt pleasant on approach, denies complaints at this time.  Pt was positive for evening wrap up group, interacting appropriately with peers on the unit.  PT has signed her 72 hour request for discharge at 1815 today.  Pt denies SI/HI/AVH at this time.  A.  Support and encouragement offered  R.  Pt remains safe on the unit, will continue to monitor.

## 2017-01-07 NOTE — ED Notes (Signed)
[facility] RN to review patient chart for room placement.

## 2017-01-08 DIAGNOSIS — F603 Borderline personality disorder: Secondary | ICD-10-CM

## 2017-01-08 DIAGNOSIS — F39 Unspecified mood [affective] disorder: Secondary | ICD-10-CM

## 2017-01-08 DIAGNOSIS — G4700 Insomnia, unspecified: Secondary | ICD-10-CM

## 2017-01-08 DIAGNOSIS — F419 Anxiety disorder, unspecified: Secondary | ICD-10-CM

## 2017-01-08 DIAGNOSIS — F332 Major depressive disorder, recurrent severe without psychotic features: Secondary | ICD-10-CM

## 2017-01-08 MED ORDER — ARIPIPRAZOLE 5 MG PO TABS
5.0000 mg | ORAL_TABLET | Freq: Every day | ORAL | Status: DC
  Administered 2017-01-08 – 2017-01-09 (×2): 5 mg via ORAL
  Filled 2017-01-08 (×4): qty 1
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
## 2017-01-08 NOTE — Progress Notes (Signed)
D Pt is seen OOB UAL on the 400 hall today. Tolerated well. She makes god eye contact. She is articulate, cooperative and pleasant as she meets this Clinical research associate. She completes her daily assessment and on this she writes she  Denies SI today and she rates her depression, hopelessness and anxeity " 1-2/1/4", respectively. A She is engaged in her recovery as evidenced by her participation in her groups, her ability to articulate what is her understanding of her issues " I got really stressed out over school and I know I need somebody to talk to about it... To help me learn to handle it better"." I really wanted to go to out patient therapy". R Safety is in place.

## 2017-01-08 NOTE — H&P (Signed)
Psychiatric Admission Assessment Adult  Patient Identification: Teresa Braun MRN:  [id number] Date of Evaluation:  01/08/2017 Chief Complaint:  MDD Principal Diagnosis: Borderline personality disorder ([place]) Diagnosis:   Patient Active Problem List   Diagnosis Date Noted  . Borderline personality disorder ([place]) [F60.3] 01/08/2017  . Major depressive disorder, recurrent severe without psychotic features ([place]) [F33.2] 01/07/2017   History of Present Illness:  ED Note: 21 y.o. female that presents this date from [facility] after meeting with a counselor and expressing thoughts off self harm with a plan to hang herself, cut herself or overdose. Patient reports having ongoing S/I for the last six months but denies any previous attempts.gestures. Patient does admit to one episode of cutting two weeks ago but would not elaborate on details. Patient denies any prior episodes of self harm. Patient denies receiving any current OP services but does report that she was involved with counseling/medication management at [facility] briefly (two months) last year in 2017. Patient states she "thinks" she was on a mood stabilizer through [facility] for two months but is unsure. Patient states she has never been diagnosed with any MH disorder but feels she suffers from depression with symptoms to include: decreased sleep (4 hours a night) for the last "few months" and excessive worrying over school and relationships. Patient admits to [place] stating she "sees and hears shadow people" but is vague in reference to details. Patient reports daily Cannabis use (up to 1 gram daily with last use on 01/06/17) and sporadic alcohol use stating she has "a couple drinks a week." Patient reports last use 2 days ago stating she had a drink." Patient is time/place oriented and denies any legal issues. Per notes, patient is a Ship broker at [employer]. Patient reports having suicidal thoughts. Patient states that she has 3 plans- 1-to cut herself, 2-hang herself,  and 3-to overdose and lay on the train tracks by her house. Patient denies HI. Patient states at night she is wide awake and sees images-the shadow people, naked lady, etc. Patient states recreational use of marijuana since the summer. Patient states she last drank alcohol 2 days ago.  [facility] Assessment: 21 year old female pt admitted on voluntary basis. On admission, she reports that she went to her counselor at school and spoke about having self harm thoughts and then subsequently was brought to the ED. She denies any current SI on admission and is able to contract for safety while on the unit. She does endorse that she did seek out her counselor and has been having on-going depressive symptoms. She reports that she has been on medications but reports that she is not on any currently. She does report that is open to starting back on medications while she is here. She reports that she lives off-campus with some roommates and reports that she will go there after discharge.  Today patient is seen in the day room and has been appropriate and interacting well with others. She reports that she has been having worsening depression and that she may have Bipolar but her counselor at school told her she didn't. She could not tell me any medications she has been on. She reports that she is willing to take whatever medications we prescribe. She states that she usually can push off depression and doesn't have any anxiety. Sh estates that she has seen the shadow people, "You know you can read about them online." She is informed that she doesn't present with any Bipolar traits today and she then states "  I stay awake for 3-5 days without sleep or with only a couple of hours and I feel rested. I have racing thoughts. I spend a lot of [name] when I don't need to." Patient details mostly online availability of Bipolar I symptoms. Patient doesn't appear depressed and is seen dancing in the hallway before the interview. She is seen in  group and is participating and interacting appropriately. Patient denies any SI/HI/AVH and contracts for safety.     Associated Signs/Symptoms: Depression Symptoms:  suicidal thoughts with specific plan, (Hypo) Manic Symptoms:  Elevated Mood, Flight of Ideas, Community education officer, Hallucinations, Labiality of Mood, Anxiety Symptoms:  Excessive Worry, Social Anxiety, Psychotic Symptoms:  Hallucinations: Visual Paranoia, PTSD Symptoms: NA Total Time spent with patient: 45 minutes  Past Psychiatric History: reports having hallucinations since a child and depression for years   Is the patient at risk to self? No.  Has the patient been a risk to self in the past 6 months? No.  Has the patient been a risk to self within the distant past? No.  Is the patient a risk to others? No.  Has the patient been a risk to others in the past 6 months? No.  Has the patient been a risk to others within the distant past? No.   Prior Inpatient Therapy:   Prior Outpatient Therapy:    Alcohol Screening: 1. How often do you have a drink containing alcohol?: 2 to 3 times a week 2. How many drinks containing alcohol do you have on a typical day when you are drinking?: 1 or 2 3. How often do you have six or more drinks on one occasion?: Monthly Preliminary Score: 2 4. How often during the last year have you found that you were not able to stop drinking once you had started?: Never 5. How often during the last year have you failed to do what was normally expected from you becasue of drinking?: Never 6. How often during the last year have you needed a first drink in the morning to get yourself going after a heavy drinking session?: Never 7. How often during the last year have you had a feeling of guilt of remorse after drinking?: Never 8. How often during the last year have you been unable to remember what happened the night before because you had been drinking?: Never 9. Have you or someone else been  injured as a result of your drinking?: No 10. Has a relative or friend or a doctor or another health worker been concerned about your drinking or suggested you cut down?: No Alcohol Use Disorder Identification Test Final Score (AUDIT): 5 Brief Intervention: AUDIT score less than 7 or less-screening does not suggest unhealthy drinking-brief intervention not indicated Substance Abuse History in the last 12 months:  Yes.   Consequences of Substance Abuse: Patient reports THC use but is negative Previous Psychotropic Medications: No  Psychological Evaluations: No  Past Medical History:  Past Medical History:  Diagnosis Date  . Bartholin cyst     Past Surgical History:  Procedure Laterality Date  . DENTAL SURGERY     Family History:  Family History  Problem Relation Age of Onset  . Irritable bowel syndrome Mother    Family Psychiatric  History: Denies Tobacco Screening: Have you used any form of tobacco in the last 30 days? (Cigarettes, Smokeless Tobacco, Cigars, and/or Pipes): No Social History:  History  Alcohol Use  . Yes    Comment: drinks 1-2 times a week  History  Drug Use  . Types: Marijuana    Comment: daily use    Additional Social History: Marital status: Long term relationship Long term relationship, how long?: 3 months dating, has known 2 years What types of issues is patient dealing with in the relationship?: Communication issues - working on it Are you sexually active?: Yes What is your sexual orientation?: Heterosexual Does patient have children?: No    Allergies:  No Known Allergies Lab Results:  Results for orders placed or performed during the hospital encounter of 01/07/17 (from the past 48 hour(s))  Rapid urine drug screen (hospital performed)     Status: None   Collection Time: 01/07/17 12:26 PM  Result Value Ref Range   Opiates NONE DETECTED NONE DETECTED   Cocaine NONE DETECTED NONE DETECTED   Benzodiazepines NONE DETECTED NONE DETECTED    Amphetamines NONE DETECTED NONE DETECTED   Tetrahydrocannabinol NONE DETECTED NONE DETECTED   Barbiturates NONE DETECTED NONE DETECTED    Comment:        DRUG SCREEN FOR MEDICAL PURPOSES ONLY.  IF CONFIRMATION IS NEEDED FOR ANY PURPOSE, NOTIFY LAB WITHIN 5 DAYS.        LOWEST DETECTABLE LIMITS FOR URINE DRUG SCREEN Drug Class       Cutoff (ng/mL) Amphetamine      1000 Barbiturate      200 Benzodiazepine   536 Tricyclics       644 Opiates          300 Cocaine          300 THC              50   Comprehensive metabolic panel     Status: None   Collection Time: 01/07/17 12:55 PM  Result Value Ref Range   Sodium 139 135 - 145 mmol/L   Potassium 3.7 3.5 - 5.1 mmol/L   Chloride 107 101 - 111 mmol/L   CO2 25 22 - 32 mmol/L   Glucose, Bld 99 65 - 99 mg/dL   BUN 7 6 - 20 mg/dL   Creatinine, Ser 0.70 0.44 - 1.00 mg/dL   Calcium 9.1 8.9 - 10.3 mg/dL   Total Protein 7.7 6.5 - 8.1 g/dL   Albumin 4.3 3.5 - 5.0 g/dL   AST 24 15 - 41 U/L   ALT 17 14 - 54 U/L   Alkaline Phosphatase 44 38 - 126 U/L   Total Bilirubin 0.6 0.3 - 1.2 mg/dL   GFR calc non Af Amer >60 >60 mL/min   GFR calc Af Amer >60 >60 mL/min    Comment: (NOTE) The eGFR has been calculated using the CKD EPI equation. This calculation has not been validated in all clinical situations. eGFR's persistently <60 mL/min signify possible Chronic Kidney Disease.    Anion gap 7 5 - 15  cbc     Status: Abnormal   Collection Time: 01/07/17 12:55 PM  Result Value Ref Range   WBC 5.6 4.0 - 10.5 K/uL   RBC 4.54 3.87 - 5.11 MIL/uL   Hemoglobin 13.2 12.0 - 15.0 g/dL   HCT 39.4 36.0 - 46.0 %   MCV 86.8 78.0 - 100.0 fL   MCH 29.1 26.0 - 34.0 pg   MCHC 33.5 30.0 - 36.0 g/dL   RDW 13.0 11.5 - 15.5 %   Platelets 139 (L) 150 - 400 K/uL  Ethanol     Status: None   Collection Time: 01/07/17  1:02 PM  Result Value Ref Range  Alcohol, Ethyl (B) <10 <10 mg/dL    Comment:        LOWEST DETECTABLE LIMIT FOR SERUM ALCOHOL IS 10  mg/dL FOR MEDICAL PURPOSES ONLY Please note change in reference range.   Salicylate level     Status: None   Collection Time: 01/07/17  1:02 PM  Result Value Ref Range   Salicylate Lvl <6.4 2.8 - 30.0 mg/dL  Acetaminophen level     Status: Abnormal   Collection Time: 01/07/17  1:02 PM  Result Value Ref Range   Acetaminophen (Tylenol), Serum <10 (L) 10 - 30 ug/mL    Comment:        THERAPEUTIC CONCENTRATIONS VARY SIGNIFICANTLY. A RANGE OF 10-30 ug/mL MAY BE AN EFFECTIVE CONCENTRATION FOR MANY PATIENTS. HOWEVER, SOME ARE BEST TREATED AT CONCENTRATIONS OUTSIDE THIS RANGE. ACETAMINOPHEN CONCENTRATIONS >150 ug/mL AT 4 HOURS AFTER INGESTION AND >50 ug/mL AT 12 HOURS AFTER INGESTION ARE OFTEN ASSOCIATED WITH TOXIC REACTIONS.   I-Stat beta hCG blood, ED     Status: None   Collection Time: 01/07/17  1:09 PM  Result Value Ref Range   I-stat hCG, quantitative <5.0 <5 mIU/mL   Comment 3            Comment:   GEST. AGE      CONC.  (mIU/mL)   <=1 WEEK        5 - 50     2 WEEKS       50 - 500     3 WEEKS       100 - 10,000     4 WEEKS     1,000 - 30,000        FEMALE AND NON-PREGNANT FEMALE:     LESS THAN 5 mIU/mL     Blood Alcohol level:  Lab Results  Component Value Date   ETH <10 40/34/7425    Metabolic Disorder Labs:  No results found for: HGBA1C, MPG No results found for: PROLACTIN No results found for: CHOL, TRIG, HDL, CHOLHDL, VLDL, LDLCALC  Current Medications: Current Facility-Administered Medications  Medication Dose Route Frequency Provider Last Rate Last Dose  . acetaminophen (TYLENOL) tablet 650 mg  650 mg Oral Q4H PRN Patrecia Pour, NP      . alum & mag hydroxide-simeth (MAALOX/MYLANTA) 200-200-20 MG/5ML suspension 30 mL  30 mL Oral Q4H PRN Patrecia Pour, NP      . ARIPiprazole (ABILIFY) tablet 5 mg  5 mg Oral Daily Jaziel Bennett, Lowry Ram, FNP   5 mg at 01/08/17 1319  . hydrOXYzine (ATARAX/VISTARIL) tablet 25 mg  25 mg Oral Q6H PRN Lindon Romp A, NP      .  magnesium hydroxide (MILK OF MAGNESIA) suspension 30 mL  30 mL Oral Daily PRN Patrecia Pour, NP      . ondansetron (ZOFRAN) tablet 4 mg  4 mg Oral Q8H PRN Patrecia Pour, NP      . traZODone (DESYREL) tablet 50 mg  50 mg Oral QHS PRN,MR X 1 Lindon Romp A, NP       PTA Medications: Prescriptions Prior to Admission  Medication Sig Dispense Refill Last Dose  . doxycycline (VIBRAMYCIN) 100 MG capsule Take 1 capsule (100 mg total) by mouth 2 (two) times daily. (Patient not taking: Reported on 01/07/2017) 20 capsule 0 Completed Course at Unknown time  . ibuprofen (ADVIL,MOTRIN) 800 MG tablet Take 1 tablet (800 mg total) by mouth 3 (three) times daily. (Patient not taking: Reported on 10/11/2015) 30 tablet 0  Not Taking at Unknown time  . ondansetron (ZOFRAN ODT) 4 MG disintegrating tablet Take 1 tablet (4 mg total) by mouth every 8 (eight) hours as needed for nausea. (Patient not taking: Reported on 01/07/2017) 6 tablet 0 Completed Course at Unknown time  . sulfamethoxazole-trimethoprim (BACTRIM DS,SEPTRA DS) 800-160 MG tablet Take 2 tablets by mouth 2 (two) times daily. (Patient not taking: Reported on 01/07/2017) 40 tablet 0 Completed Course at Unknown time  . traMADol (ULTRAM) 50 MG tablet Take 1 tablet (50 mg total) by mouth every 6 (six) hours as needed. (Patient not taking: Reported on 10/11/2015) 15 tablet 0 Not Taking at Unknown time    Musculoskeletal: Strength & Muscle Tone: within normal limits Gait & Station: normal Patient leans: N/A  Psychiatric Specialty Exam: Physical Exam  Nursing note and vitals reviewed. Constitutional: She is oriented to person, place, and time. She appears well-developed and well-nourished.  Cardiovascular: Normal rate.   Respiratory: Effort normal.  Musculoskeletal: Normal range of motion.  Neurological: She is alert and oriented to person, place, and time.  Skin: Skin is warm.    Review of Systems  Constitutional: Negative.   HENT: Negative.   Eyes:  Negative.   Respiratory: Negative.   Cardiovascular: Negative.   Gastrointestinal: Negative.   Genitourinary: Negative.   Musculoskeletal: Negative.   Skin: Negative.   Neurological: Negative.   Endo/Heme/Allergies: Negative.     Blood pressure 112/81, pulse 70, temperature 98.7 F (37.1 C), temperature source Oral, resp. rate 12, height _0  (1.651 m), weight 54.9 kg (121 lb), last menstrual period 01/07/2017.Body mass index is 20.14 kg/m.  General Appearance: Casual  Eye Contact:  Good  Speech:  Clear and Coherent and Normal Rate  Volume:  Normal  Mood:  Euthymic  Affect:  Appropriate  Thought Process:  Goal Directed and Descriptions of Associations: Intact  Orientation:  Full (Time, Place, and Person)  Thought Content:  WDL  Suicidal Thoughts:  No  Homicidal Thoughts:  No  Memory:  Immediate;   Good Recent;   Good Remote;   Good  Judgement:  Good  Insight:  Fair  Psychomotor Activity:  Normal  Concentration:  Concentration: Good and Attention Span: Good  Recall:  Good  Fund of Knowledge:  Good  Language:  Good  Akathisia:  No  Handed:  Right  AIMS (if indicated):     Assets:  Desire for Improvement Housing Physical Health Social Support Transportation  ADL's:  Intact  Cognition:  WNL  Sleep:  Number of Hours: 7    Treatment Plan Summary: Daily contact with patient to assess and evaluate symptoms and progress in treatment, Medication management and Plan is to:  -Start Abilify 5 mg PO Daily for mood stability -Continue Trazodone 50 mg PO QHS PRN for insomnia -Continue Vistaril 25 mg PO Q6H PRN for anxiety -Encourage group therapy participation   Observation Level/Precautions:  15 minute checks  Laboratory:  Reviewed  Psychotherapy:  Group therapy  Medications:  See MAR  Consultations:  As needed  Discharge Concerns:  Compliance  Estimated LOS: 3-5 Days  Other:  Admit to [place] for Primary Diagnosis: Borderline personality  disorder ([place]) Long Term Goal(s): Improvement in symptoms so as ready for discharge  Short Term Goals: Ability to demonstrate self-control will improve and Ability to identify and develop effective coping behaviors will improve  Physician Treatment Plan for Secondary Diagnosis: Principal Problem:   Borderline personality disorder ([place]) Active Problems:   Major depressive  disorder, recurrent severe without psychotic features ([place])  Long Term Goal(s): Improvement in symptoms so as ready for discharge  Short Term Goals: Ability to verbalize feelings will improve and Ability to disclose and discuss suicidal ideas  I certify that inpatient services furnished can reasonably be expected to improve the patient's condition.    Lewis Shock, FNP 9/29/20182:46 PM

## 2017-01-08 NOTE — [facility] Group Notes (Signed)
LCSW Group Therapy Note  Date/Time:  01/08/2017   10:00AM-11:00AM  Type of Therapy and Topic:  Group Therapy:  Fears and Unhealthy/Healthy Coping Skills  Participation Level:  Active   Description of Group:  The focus of this group was to discuss some of the prevalent fears that patients experience, and to identify the commonalities among group members.  An exercise was used to initiate the discussion, followed by writing on the white board a group-generated list of unhealthy coping and healthy coping techniques to deal with each fear.    Therapeutic Goals: 1. Patient will be able to distinguish between healthy and unhealthy coping skills 2. Patient will identify and describe 3 fears they experience 3. Patient will identify one positive coping strategy for each fear they experience 4. Patient will respond empathetically to peers' statements regarding fears they experience  Summary of Patient Progress:  The patient expressed a fear of being pregnant.  She said she would like to learn more about doing reality checks and creating a variety of safety plans.  Therapeutic Modalities Cognitive Behavioral Therapy Motivational Interviewing  Ambrose Mantle, LCSW

## 2017-01-08 NOTE — Progress Notes (Signed)
Writer spoke with patient 1:1 and she reports that she is ready for discharge because she is bored being here. 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 She plans to see her counselor at school once discharged. She reported feeling nauseated after taking her Abilify on today and is hoping it was not caused by this medication. She attended group and has been up in the dayroom interacting with peers. Support given and safety maintained on uni with 15 min checks.

## 2017-01-08 NOTE — [facility] Group Notes (Signed)
Identifying Needs    Date:  01/08/2017  Time:  1100  Type of Therapy:  Nurse Education  /  The groop focuses on teaching patients hwo to identify their needs and then how to develop skills needed to get them met.   Participation Level:  Active  Participation Quality:  Attentive  Affect:  Appropriate  Cognitive:  Alert  Insight:  Appropriate  Engagement in Group:  Engaged  Modes of Intervention:  Education  Summary of Progress/Problems:  ,  Lynn 01/08/2017, 3:16 PM 

## 2017-01-08 NOTE — [facility] Suicide Risk Assessment (Signed)
[facility] Admission Suicide Risk Assessment   Nursing information obtained from:    Demographic factors:    Current Mental Status:    Loss Factors:    Historical Factors:    Risk Reduction Factors:     Total Time spent with patient: 30 minutes Principal Problem: <principal problem not specified> Diagnosis:   Patient Active Problem List   Diagnosis Date Noted  . Major depressive disorder, recurrent severe without psychotic features (HCC) [F33.2] 01/07/2017   Subjective Data:  21 y.o Caucasian female, single, college student. Background history of early life trauma, mood swings and chronic suicidal thoughts. Presented to the ER voluntarily after being referred by her couselor. Reports suicidal thoughts has been intensifying. Says she has been researching on the Internet. Routine  labs are within normal limits. UDS is negative.  BAL <5 mg/dl. At interview, says mood swings are short lived. They are precipitated by arguments with friends. Reports poor interpersonal relationship. Feels people judge her negatively. Says it makes her have poor self image at times. Says she then panics and begins to see suicide as a way out. In current relationship for three months. Says it has it's ups and down. No legal issues. No academic issues. No financial constraints. No other stressor. Says she has never attempted in the past. No past history of self mutilation. 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 No access to weapons. No past history of psychiatric hospitalization.  Says she has been feeling fine since she came in here. Loves the groups here. No evidence of depression. No evidence of mania. No evidence of psychosis.  She has agreed to take Abilify. We talked about psychotherapy and she seems motivated to work through coping mechanisms here and with her therapist when discharged.   Continued Clinical Symptoms:  Alcohol Use Disorder Identification Test Final Score (AUDIT): 5 The "Alcohol Use Disorders  Identification Test", Guidelines for Use in Primary Care, Second Edition.  World Science writer [facility]). Score between 0-7:  no or low risk or alcohol related problems. Score between 8-15:  moderate risk of alcohol related problems. Score between 16-19:  high risk of alcohol related problems. Score 20 or above:  warrants further diagnostic evaluation for alcohol dependence and treatment.   CLINICAL FACTORS:   Personality Disorders:   Cluster B   Musculoskeletal: Strength & Muscle Tone: within normal limits Gait & Station: normal Patient leans: N/A  Psychiatric Specialty Exam: Physical Exam  ROS  Blood pressure 112/81, pulse 70, temperature 98.7 F (37.1 C), temperature source Oral, resp. rate 12, height  (1.651 m), weight 54.9 kg (121 lb), last menstrual period 01/07/2017.Body mass index is 20.14 kg/m.  General Appearance: Neatly dressed, pleasant, engaging well and cooperative. Appropriate behavior. Not in any distress. Good relatedness. Not internally stimulated.  Eye Contact:  Good  Speech:  Clear and Coherent and Normal Rate  Volume:  Normal  Mood:  Euthymic  Affect:  Appropriate and Full Range  Thought Process:  Linear  Orientation:  Full (Time, Place, and Person)  Thought Content:  No delusional theme. No preoccupation with violent thoughts. No negative ruminations. No obsession.  No hallucination in any modality.   Suicidal Thoughts:  Fleeting suicidal thoughts  Homicidal Thoughts:  No  Memory:  Immediate;   Good Recent;   Good Remote;   Good  Judgement:  Good  Insight:  Good  Psychomotor Activity:  Normal  Concentration:  Concentration: Good and Attention Span: Good  Recall:  Good  Fund of Knowledge:  Good  Language:  Good  Akathisia:  Negative  Handed:    AIMS (if indicated):     Assets:  Communication Skills Desire for Improvement Financial Resources/Insurance Housing Intimacy Leisure Time Physical Health Resilience Social  Support Talents/Skills Transportation Vocational/Educational  ADL's:  Intact  Cognition:  WNL  Sleep:  Number of Hours: 7      COGNITIVE FEATURES THAT CONTRIBUTE TO RISK:  None    SUICIDE RISK:   Minimal: No identifiable suicidal ideation.  Patients presenting with no risk factors but with morbid ruminations; may be classified as minimal risk based on the severity of the depressive symptoms  PLAN OF CARE:  1. Suicide precautions 2. Abilify 5 mg daily 3. Collateral from her family  I certify that inpatient services furnished can reasonably be expected to improve the patient's condition.   Georgiann Cocker, MD 01/08/2017, 11:15 AM

## 2017-01-09 ENCOUNTER — Encounter (HOSPITAL_COMMUNITY): Payer: Self-pay | Admitting: Registered Nurse

## 2017-01-09 DIAGNOSIS — R4587 Impulsiveness: Secondary | ICD-10-CM

## 2017-01-09 DIAGNOSIS — R413 Other amnesia: Secondary | ICD-10-CM

## 2017-01-09 DIAGNOSIS — F1290 Cannabis use, unspecified, uncomplicated: Secondary | ICD-10-CM

## 2017-01-09 NOTE — [facility] Group Notes (Signed)
[facility] LCSW Group Therapy Note  Date/Time:  01/09/2017 10:00-11:00AM  Type of Therapy and Topic:  Group Therapy:  Healthy and Unhealthy Supports  Participation Level:  Active   Description of Group:  Patients in this group were introduced to the idea of adding a variety of healthy supports to address the various needs in their lives. The picture on the front of Sunday's workbook was used to demonstrate why more supports are needed in every patient's life.  Patients identified and described healthy supports versus unhealthy supports in general, then gave examples of each in their own lives.   They discussed what additional healthy supports could be helpful in their recovery and wellness after discharge in order to prevent future hospitalizations.   An emphasis was placed on using counselor, doctor, therapy groups, 12-step groups, and problem-specific support groups to expand supports.  They also worked as a group on developing a specific plan for several patients to deal with unhealthy supports through boundary-setting, psychoeducation with loved ones, and even termination of relationships.   Therapeutic Goals:   1)  discuss importance of adding supports to stay well once out of the hospital  2)  compare healthy versus unhealthy supports and identify some examples of each  3)  generate ideas and descriptions of healthy supports that can be added  4)  offer mutual support about how to address unhealthy supports  5)  encourage active participation in and adherence to discharge plan    Summary of Patient Progress:  The patient shared that the current healthy/unhealthy supports available in their life are a swingset on campus where she can listen to music, her friends, and music itself (healthy) while her mother is often unhealthy for her.  She participated fully and with insight.   Therapeutic Modalities:   Motivational Interviewing Brief Solution-Focused Therapy  Ambrose Mantle,  LCSW 01/09/2017, 1:01 PM

## 2017-01-09 NOTE — Progress Notes (Signed)
[facility] MD Progress Note  01/09/2017 3:07 PM URIEL HORKEY  MRN:  [id number]    Subjective:  Patient reports that she was having suicidal thoughts and her friends suggested that she get help.  States that he biggest stressors are social relationships and feeling that people just tolerate her being around but not really wanting her around.  States that she slept well last night no difficulty eating and is tolerating her medications.  At this time she denies suicidal/homicidal ideation, psychosis, and paranoia. Student at [employer]  Objective:  Woodfin Ganja, 21 y.o., female patient seen by  this provider.  Chart reviewed and face to face evaluation on 01/09/17.   On evaluation:  Veleta A Lennox is calm/cooperative, alert/oriented x4.  Sitting in hall interacting appropriate with peers. Reported eating/sleeping without difficulty, tolerating medications without adverse reaction.    History of "vision" but denies at this time.  States that she is ready to go home and signed a 72 hour Friday night.     Principal Problem: Borderline personality disorder (HCC) Diagnosis:   Patient Active Problem List   Diagnosis Date Noted  . Borderline personality disorder (HCC) [F60.3] 01/08/2017  . Major depressive disorder, recurrent severe without psychotic features (HCC) [F33.2] 01/07/2017   Total Time spent with patient: 30 minutes  Past Psychiatric History: Major depression, psychosis  Past Medical History:  Past Medical History:  Diagnosis Date  . Bartholin cyst     Past Surgical History:  Procedure Laterality Date  . DENTAL SURGERY     Family History:  Family History  Problem Relation Age of Onset  . Irritable bowel syndrome Mother    Family Psychiatric  History: Denies Social History:  History  Alcohol Use  . Yes    Comment: drinks 1-2 times a week     History  Drug Use  . Types: Marijuana    Comment: daily use    Social History   Social History  . Marital status: Single   Spouse name: N/A  . Number of children: N/A  . Years of education: N/A   Social History Main Topics  . Smoking status: Never Smoker  . Smokeless tobacco: Never Used  . Alcohol use Yes     Comment: drinks 1-2 times a week  . Drug use: Yes    Types: Marijuana     Comment: daily use  . Sexual activity: Yes    Birth control/ protection: Condom   Other Topics Concern  . None   Social History Narrative  . None   Additional Social History:                         Sleep: Good  Appetite:  Good  Current Medications: Current Facility-Administered Medications  Medication Dose Route Frequency Provider Last Rate Last Dose  . acetaminophen (TYLENOL) tablet 650 mg  650 mg Oral Q4H PRN Charm Rings, NP      . alum & mag hydroxide-simeth (MAALOX/MYLANTA) 200-200-20 MG/5ML suspension 30 mL  30 mL Oral Q4H PRN Charm Rings, NP      . ARIPiprazole (ABILIFY) tablet 5 mg  5 mg Oral Daily Money, Gerlene Burdock, FNP   5 mg at 01/09/17 4098  . hydrOXYzine (ATARAX/VISTARIL) tablet 25 mg  25 mg Oral Q6H PRN Nira Conn A, NP      . magnesium hydroxide (MILK OF MAGNESIA) suspension 30 mL  30 mL Oral Daily PRN Charm Rings, NP      .  ondansetron (ZOFRAN) tablet 4 mg  4 mg Oral Q8H PRN Charm Rings, NP      . traZODone (DESYREL) tablet 50 mg  50 mg Oral QHS PRN,MR X 1 Nira Conn A, NP        Lab Results: No results found for this or any previous visit (from the past 48 hour(s)).  Blood Alcohol level:  Lab Results  Component Value Date   ETH <10 01/07/2017    Metabolic Disorder Labs: No results found for: HGBA1C, MPG No results found for: PROLACTIN No results found for: CHOL, TRIG, HDL, CHOLHDL, VLDL, LDLCALC  Physical Findings: AIMS: Facial and Oral Movements Muscles of Facial Expression: None, normal Lips and Perioral Area: None, normal Jaw: None, normal Tongue: None, normal,Extremity Movements Upper (arms, wrists, hands, fingers): None, normal Lower (legs, knees,  ankles, toes): None, normal, Trunk Movements Neck, shoulders, hips: None, normal, Overall Severity Severity of abnormal movements (highest score from questions above): None, normal Incapacitation due to abnormal movements: None, normal Patient's awareness of abnormal movements (rate only patient's report): No Awareness, Dental Status Current problems with teeth and/or dentures?: No Does patient usually wear dentures?: No  CIWA:    COWS:     Musculoskeletal: Strength & Muscle Tone: within normal limits Gait & Station: normal Patient leans: N/A  Psychiatric Specialty Exam: Physical Exam  Nursing note and vitals reviewed. Constitutional: She is oriented to person, place, and time.  Neck: Normal range of motion.  Respiratory: Effort normal.  Musculoskeletal: Normal range of motion.  Neurological: She is alert and oriented to person, place, and time.  Skin: Skin is warm and dry.  Psychiatric: Her speech is normal. Hallucinating: states in past but denies at this time. Cognition and memory are normal. She expresses impulsivity. She expresses suicidal ideation.    Review of Systems  Psychiatric/Behavioral: Positive for depression ( 1/10) and memory loss. Hallucinations: Denies at this time. Nervous/anxious: 2/10.   All other systems reviewed and are negative.   Blood pressure 115/74, pulse 100, temperature 98.6 F (37 C), temperature source Oral, resp. rate 16, height  (1.651 m), weight 54.9 kg (121 lb), last menstrual period 01/07/2017.Body mass index is 20.14 kg/m.  General Appearance: Casual  Eye Contact:  Good  Speech:  Clear and Coherent and Normal Rate  Volume:  Normal  Mood:  "Good, better"  Affect:  Appropriate and Congruent  Thought Process:  Coherent and Goal Directed  Orientation:  Full (Time, Place, and Person)  Thought Content:  Denies hallucinations, delusions, and paranoia  Suicidal Thoughts:  Denies at this time  Homicidal Thoughts:  No  Memory:  Immediate;    Good Recent;   Good Remote;   Good  Judgement:  Fair  Insight:  Fair  Psychomotor Activity:  Normal  Concentration:  Concentration: Good and Attention Span: Good  Recall:  Good  Fund of Knowledge:  Fair  Language:  Good  Akathisia:  No  Handed:  Right  AIMS (if indicated):     Assets:  Communication Skills Desire for Improvement Housing Physical Health Resilience Social Support Transportation  ADL's:  Intact  Cognition:  WNL  Sleep:  Number of Hours: 6.75   Treatment Plan Summary: Daily contact with patient to assess and evaluate symptoms and progress in treatment, Medication management and Plan :  -Zaley A Roosevelt was admitted to [facility] under the service of Cobos, Rockey Situ, MD for Borderline personality disorder [facility]) crisis management, and stabilization. -Routine labs; which include CBC,  CMP, UA, ETOH, Urine pregnancy, HCG, and UDS were reviewed  -medication management:  Continue Abilify 5 mg for mood stabilization and depression Vistaril 25 mg Tid prn for Anxiety Trazodone 50 mg Q hs for insomnia -Will maintain observation checks every 15 minutes for safety. -Encourage group session attendance/participation -Social work will consult with family for collateral information and discuss discharge and follow up plan.  Rankin, Shuvon, NP 01/09/2017, 3:07 PM

## 2017-01-09 NOTE — Progress Notes (Signed)
Patient denies SI, HI and AVH.  Patient has been present on the unit attending groups and interacting appropriately with peers.   Patient has had no incidents of behavioral dyscontrol.   Assess patient for safety, offer medications as prescribed, engage patient in 1:1 staff talks.   Continue to monitor as planned. Patient able to contract for safety.  

## 2017-01-09 NOTE — Progress Notes (Signed)
Adult Psychoeducational Group Note  Date:  01/09/2017 Time:  1:59 AM  Group Topic/Focus:  Wrap-Up Group:   The focus of this group is to help patients review their daily goal of treatment and discuss progress on daily workbooks.  Participation Level:  Active  Participation Quality:  Appropriate  Affect:  Appropriate  Cognitive:  Appropriate  Insight: Appropriate  Engagement in Group:  Engaged  Modes of Intervention:  Discussion  Additional Comments:  Pt stated her goal for today was to interact more with her peers. Pt stated that she accomplished this goal. Pt rated her day a 8 out of 10. Pt stated she attended all groups held today.   Felipa Furnace 01/09/2017, 1:59 AM

## 2017-01-09 NOTE — Progress Notes (Signed)
Patient has been up and active on the unit, attended group this evening and has voiced no complaints.She is hopeful to discharge on tomorrow. Patient currently denies having pain, -si/hi/a/v hall. Support and encouragement offered, safety maintained on unit, will continue to monitor.  

## 2017-01-10 DIAGNOSIS — R443 Hallucinations, unspecified: Secondary | ICD-10-CM

## 2017-01-10 DIAGNOSIS — R45851 Suicidal ideations: Secondary | ICD-10-CM

## 2017-01-10 MED ORDER — ARIPIPRAZOLE 5 MG PO TABS: 5.0000 mg | ORAL_TABLET | Freq: Every day | ORAL | 0 refills | Status: AC

## 2017-01-10 MED ORDER — HYDROXYZINE HCL 25 MG PO TABS: 25.0000 mg | ORAL_TABLET | Freq: Three times a day (TID) | ORAL | 0 refills | Status: AC | PRN

## 2017-01-10 MED ORDER — ARIPIPRAZOLE 5 MG PO TABS
5.0000 mg | ORAL_TABLET | Freq: Every day | ORAL | Status: DC
  Filled 2017-01-10: qty 1

## 2017-01-10 MED ORDER — TRAZODONE HCL 50 MG PO TABS: 50.0000 mg | ORAL_TABLET | Freq: Every evening | ORAL | 0 refills | Status: AC | PRN

## 2017-01-10 NOTE — Progress Notes (Signed)
Recreation Therapy Notes  Date: 01/10/17 Time: 0930 Location: 300 Hall Dayroom  Group Topic: Stress Management  Goal Area(s) Addresses:  Patient will verbalize importance of using healthy stress management.  Patient will identify positive emotions associated with healthy stress management.   Behavioral Response: Engaged  Intervention: Stress Management  Activity :  LRT introduced the stress management technique of guided imagery.  LRT read a script that allowed patients to take a mental vacation to the beach.  Patients were to follow along as LRT read script to fully engage in the technique.  Education:  Stress Management, Discharge Planning.   Education Outcome: Acknowledges edcuation/In group clarification offered/Needs additional education  Clinical Observations/Feedback: Pt attended group.   Caroll Rancher, LRT/CTRS         Lillia Abed, Stephanny Tsutsui A 01/10/2017 1:33 PM

## 2017-01-10 NOTE — [facility] Suicide Risk Assessment (Signed)
[facility] INPATIENT:  Family/Significant Other Suicide Prevention Education  Suicide Prevention Education:  Education Completed; Lisette Grinder boyfriend, [phone],  (name of family member/significant other) has been identified by the patient as the family member/significant other with whom the patient will be residing, and identified as the person(s) who will aid the patient in the event of a mental health crisis (suicidal ideations/suicide attempt).  With written consent from the patient, the family member/significant other has been provided the following suicide prevention education, prior to the and/or following the discharge of the patient.  The suicide prevention education provided includes the following:  Suicide risk factors  Suicide prevention and interventions  National Suicide Hotline telephone number  [facility] assessment telephone number  [facility] Emergency Assistance 911  [facility] and/or Residential Mobile Crisis Unit telephone number  Request made of family/significant other to:  Remove weapons (e.g., guns, rifles, knives), all items previously/currently identified as safety concern.    Remove drugs/medications (over-the-counter, prescriptions, illicit drugs), all items previously/currently identified as a safety concern.  The family member/significant other verbalizes understanding of the suicide prevention education information provided.  The family member/significant other agrees to remove the items of safety concern listed above.  No access to guns; does not take medications in the past.  Discussed ways to improve medication safety in the home as patient is now being placed on medication.  Reviewed signs of suicidality - per boyfriend "mostly she gets a little distant" when she is suicidal, "I'm usually able to pick up on it."  Spends more time w her, assesses patient's needs.  Reviewed aftercare arrangements and community resources available to  patient on campus.  Boyfriend plans to pick patient up today at approx 1 PM     Sallee Lange 01/10/2017, 11:35 AM

## 2017-01-10 NOTE — Progress Notes (Signed)
Patient ID: Teresa Braun, female   DOB: 1996-01-15, 21 y.o.   MRN: [id number]  Discharge Note- Belongings returned to patient at time of discharge. Patient denies any pain or discomfort. Discharge instructions and medications were reviewed with patient. Patient verbalized understanding of both medications and discharge instructions. Patient discharged to the lobby where her ride was waiting. Q15 minute safety checks maintained until time of discharge. No distress upon discharge.

## 2017-01-10 NOTE — Progress Notes (Signed)
Adult Psychoeducational Group Note  Date:  01/10/2017 Time:  2:04 AM  Group Topic/Focus:  Wrap-Up Group:   The focus of this group is to help patients review their daily goal of treatment and discuss progress on daily workbooks.  Participation Level:  Active  Participation Quality:  Appropriate  Affect:  Appropriate  Cognitive:  Appropriate  Insight: Appropriate  Engagement in Group:  Engaged  Modes of Intervention:  Discussion  Additional Comments:  Pt her goal today was to talk with doctors about her discharge plan. Pt stated she accomplished her goal today and felt good about it. Pt rated her day a 10. Pt stated she attended all groups held today.  Felipa Furnace 01/10/2017, 2:04 AM

## 2017-01-10 NOTE — Progress Notes (Signed)
Patient ID: Teresa Braun, female   DOB: 12/10/1995, 21 y.o.   MRN: [id number]  DAR: Pt. Denies SI/HI and A/V Hallucinations. She reports her sleep last night was fair, appetite is fair, energy level is normal, and concentration is good. She rates her depression 0/10, hopelessness 0/10, and anxiety 2/10. Patient does not report any pain or discomfort at this time. Support and encouragement provided to the patient. Scheduled medication refused this morning as patient reports that it makes her feel "groggy." NP Henri Medal was notified, patient is aware that new order states take at bedtime. Patient is receptive and cooperative. She reports that she feels ready for discharge. Q15 minute checks are maintained for safety.

## 2017-01-10 NOTE — Discharge Summary (Signed)
Physician Discharge Summary Note  Patient:  Teresa Braun is an 21 y.o., female MRN:  [id number] DOB:  1995-12-16 Patient phone:  [phone] (home)  Patient address:   [address] [place] [address],  Total Time spent with patient: 20 minutes  Date of Admission:  01/07/2017 Date of Discharge: 01/10/17   Reason for Admission:  Worsening depression with SI and plan  Principal Problem: Borderline personality disorder [facility]) Discharge Diagnoses: Patient Active Problem List   Diagnosis Date Noted  . Borderline personality disorder (HCC) [F60.3] 01/08/2017  . Major depressive disorder, recurrent severe without psychotic features (HCC) [F33.2] 01/07/2017    Past Psychiatric History: reports having hallucinations since a child and depression for years   Past Medical History:  Past Medical History:  Diagnosis Date  . Bartholin cyst     Past Surgical History:  Procedure Laterality Date  . DENTAL SURGERY     Family History:  Family History  Problem Relation Age of Onset  . Irritable bowel syndrome Mother    Family Psychiatric  History: Denies Social History:  History  Alcohol Use  . Yes    Comment: drinks 1-2 times a week     History  Drug Use  . Types: Marijuana    Comment: daily use    Social History   Social History  . Marital status: Single    Spouse name: N/A  . Number of children: N/A  . Years of education: N/A   Social History Main Topics  . Smoking status: Never Smoker  . Smokeless tobacco: Never Used  . Alcohol use Yes     Comment: drinks 1-2 times a week  . Drug use: Yes    Types: Marijuana     Comment: daily use  . Sexual activity: Yes    Birth control/ protection: Condom   Other Topics Concern  . None   Social History Narrative  . None    Hospital Course:   ED Note: 21 y.o.femalethat presents this date from [facility] after meeting with a counselor and expressing thoughts off self harm with a plan to hang herself, cut herself  or overdose. Patient reports having ongoing S/I for the last six months but denies any previous attempts.gestures. Patient does admit to one episode of cutting two weeks ago but would not elaborate on details. Patient denies any prior episodes of self harm. Patient denies receiving any current OP services but does report that she was involved with counseling/medication management at [facility] briefly (two months) last year in 2017. Patient states she "thinks" she was on a mood stabilizer through [facility] for two months but is unsure. Patient states she has never been diagnosed with any MH disorder but feels she suffers from depression with symptoms to include: decreased sleep (4 hours a night) for the last "few months" and excessive worrying over school and relationships. Patient admits to AVH stating she "sees and hears shadow people" but is vague in reference to details. Patient reports daily Cannabis use (up to 1 gram daily with last use on 01/06/17) and sporadic alcohol use stating she has "a couple drinks a week." Patient reports last use 2 days ago stating she had a drink." Patient is time/place oriented and denies any legal issues. Per notes, patient is a Consulting civil engineer at [employer]. Patient reports having suicidal thoughts. Patient states that she has 3 plans- 1-to cut herself, 2-hang herself, and 3-to overdose and lay on the train tracks by her house. Patient denies HI. Patient states at  night she is wide awake and sees images-the shadow people, naked lady, etc. Patient states recreational use of marijuana since the summer. Patient states she last drank alcohol 2 days ago.  [facility] Assessment: 21 year old female pt admitted on voluntary basis. On admission, she reports that she went to her counselor at school and spoke about having self harm thoughts and then subsequently was brought to the ED. She denies any current SI on admission and is able to contract for safety while on the unit. She does endorse that she did seek out her  counselor and has been having on-going depressive symptoms. She reports that she has been on medications but reports that she is not on any currently. She does report that is open to starting back on medications while she is here. She reports that she lives off-campus with some roommates and reports that she will go there after discharge.  Today patient is seen in the day room and has been appropriate and interacting well with others. She reports that she has been having worsening depression and that she may have Bipolar but her counselor at school told her she didn't. She could not tell me any medications she has been on. She reports that she is willing to take whatever medications we prescribe. She states that she usually can push off depression and doesn't have any anxiety. Sh estates that she has seen the shadow people, "You know you can read about them online." She is informed that she doesn't present with any Bipolar traits today and she then states "I stay awake for 3-5 days without sleep or with only a couple of hours and I feel rested. I have racing thoughts. I spend a lot of Carlita Whitcomb when I don't need to." Patient details mostly online availability of Bipolar I symptoms. Patient doesn't appear depressed and is seen dancing in the hallway before the interview. She is seen in group and is participating and interacting appropriately. Patient denies any SI/HI/AVH and contracts for safety.  Patient improved over the 2 day stay on [facility]. She was started on Abilify 5 mg PO Daily. She had continued denying SI/HI/AVH and contracting for safety. Patient signed 72 hour release form the day she arrived on the unit. Sh estates that she did not feel she needed to be here but due to her reports and symptoms she was maintained for safety and stability. Patient improved and was noticed by improved sleep, improved mood, improved affect, attending group, and interacting with staff and peers appropriately. Patient was  discharged home with boyfriend picking her up and will follow up at the [facility] counselor and psychiatrist. Patient is provided with prescriptions for her medications upon discharge.    Physical Findings: AIMS: Facial and Oral Movements Muscles of Facial Expression: None, normal Lips and Perioral Area: None, normal Jaw: None, normal Tongue: None, normal,Extremity Movements Upper (arms, wrists, hands, fingers): None, normal Lower (legs, knees, ankles, toes): None, normal, Trunk Movements Neck, shoulders, hips: None, normal, Overall Severity Severity of abnormal movements (highest score from questions above): None, normal Incapacitation due to abnormal movements: None, normal Patient's awareness of abnormal movements (rate only patient's report): No Awareness, Dental Status Current problems with teeth and/or dentures?: No Does patient usually wear dentures?: No  CIWA:    COWS:     Musculoskeletal: Strength & Muscle Tone: within normal limits Gait & Station: normal Patient leans: N/A  Psychiatric Specialty Exam: Physical Exam  Nursing note and vitals reviewed. Constitutional: She is oriented to  person, place, and time. She appears well-developed and well-nourished.  Cardiovascular:  Tachycardic  Respiratory: Effort normal.  Musculoskeletal: Normal range of motion.  Neurological: She is alert and oriented to person, place, and time.  Skin: Skin is warm.    Review of Systems  Constitutional: Negative.   HENT: Negative.   Eyes: Negative.   Respiratory: Negative.   Cardiovascular: Negative.   Gastrointestinal: Negative.   Genitourinary: Negative.   Musculoskeletal: Negative.   Skin: Negative.   Neurological: Negative.   Endo/Heme/Allergies: Negative.     Blood pressure 112/78, pulse (!) 116, temperature 98.4 F (36.9 C), temperature source Oral, resp. rate 16, height  (1.651 m), weight 54.9 kg (121 lb), last menstrual period 01/07/2017.Body mass index is 20.14 kg/m.   General Appearance: Casual  Eye Contact:  Good  Speech:  Clear and Coherent and Normal Rate  Volume:  Normal  Mood:  Euthymic  Affect:  Appropriate  Thought Process:  Coherent and Descriptions of Associations: Intact  Orientation:  Full (Time, Place, and Person)  Thought Content:  WDL  Suicidal Thoughts:  No  Homicidal Thoughts:  No  Memory:  Immediate;   Good Recent;   Good Remote;   Good  Judgement:  Good  Insight:  Good  Psychomotor Activity:  Normal  Concentration:  Concentration: Good and Attention Span: Good  Recall:  Good  Fund of Knowledge:  Good  Language:  Good  Akathisia:  No  Handed:  Right  AIMS (if indicated):     Assets:  Communication Skills Desire for Improvement Financial Resources/Insurance Housing Leisure Time Physical Health Social Support Transportation  ADL's:  Intact  Cognition:  WNL  Sleep:  Number of Hours: 6.25     Have you used any form of tobacco in the last 30 days? (Cigarettes, Smokeless Tobacco, Cigars, and/or Pipes): No  Has this patient used any form of tobacco in the last 30 days? (Cigarettes, Smokeless Tobacco, Cigars, and/or Pipes)  No  Blood Alcohol level:  Lab Results  Component Value Date   ETH <10 01/07/2017    Metabolic Disorder Labs:  No results found for: HGBA1C, MPG No results found for: PROLACTIN No results found for: CHOL, TRIG, HDL, CHOLHDL, VLDL, LDLCALC  See Psychiatric Specialty Exam and Suicide Risk Assessment completed by Attending Physician prior to discharge.  Discharge destination:  Home  Is patient on multiple antipsychotic therapies at discharge:  No   Has Patient had three or more failed trials of antipsychotic monotherapy by history:  No  Recommended Plan for Multiple Antipsychotic Therapies: NA   Allergies as of 01/10/2017   No Known Allergies     Medication List    STOP taking these medications   doxycycline 100 MG capsule Commonly known as:  VIBRAMYCIN   ibuprofen 800 MG  tablet Commonly known as:  ADVIL,MOTRIN   ondansetron 4 MG disintegrating tablet Commonly known as:  ZOFRAN ODT   sulfamethoxazole-trimethoprim 800-160 MG tablet Commonly known as:  BACTRIM DS,SEPTRA DS   traMADol 50 MG tablet Commonly known as:  ULTRAM     TAKE these medications     Indication  ARIPiprazole 5 MG tablet Commonly known as:  ABILIFY Take 1 tablet (5 mg total) by mouth at bedtime. For mood control  Indication:  mood stability   hydrOXYzine 25 MG tablet Commonly known as:  ATARAX/VISTARIL Take 1 tablet (25 mg total) by mouth 3 (three) times daily as needed for anxiety.  Indication:  Feeling Anxious   traZODone 50 MG tablet  Commonly known as:  DESYREL Take 1 tablet (50 mg total) by mouth at bedtime as needed for sleep.  Indication:  Trouble Sleeping      Follow-up Information    [facility] Follow up.   Why:  Your Child psychotherapist will call and schedule a therapy and medication management appointment Contact information: [facility]   [address]   [place]  [address] Phone:  [phone] Fax:  [phone]          Follow-up recommendations:  Continue activity as tolerated. Continue diet as recommended by your PCP. Ensure to keep all appointments with outpatient providers.  Comments:  Patient is instructed prior to discharge to: Take all medications as prescribed by his/her mental healthcare provider. Report any adverse effects and or reactions from the medicines to his/her outpatient provider promptly. Patient has been instructed & cautioned: To not engage in alcohol and or illegal drug use while on prescription medicines. In the event of worsening symptoms, patient is instructed to call the crisis hotline, 911 and or go to the nearest ED for appropriate evaluation and treatment of symptoms. To follow-up with his/her primary care provider for your other medical issues, concerns and or health care needs.    Signed: Gerlene Burdock  Loel Betancur, FNP 01/10/2017, 10:06 AM

## 2017-01-10 NOTE — [facility] Suicide Risk Assessment (Signed)
[facility] Discharge Suicide Risk Assessment   Principal Problem: Borderline personality disorder [facility]) Discharge Diagnoses:  Patient Active Problem List   Diagnosis Date Noted  . Borderline personality disorder (HCC) [F60.3] 01/08/2017  . Major depressive disorder, recurrent severe without psychotic features (HCC) [F33.2] 01/07/2017    Total Time spent with patient: 30 minutes  Musculoskeletal: Strength & Muscle Tone: within normal limits Gait & Station: normal Patient leans: N/A  Psychiatric Specialty Exam: ROS denies headache, no chest pain, no shortness of breath, no vomiting   Blood pressure 112/78, pulse (!) 116, temperature 98.4 F (36.9 C), temperature source Oral, resp. rate 16, height  (1.651 m), weight 54.9 kg (121 lb), last menstrual period 01/07/2017.Body mass index is 20.14 kg/m.  General Appearance: Well Groomed  Eye Contact::  Good  Speech:  Normal Rate409  Volume:  Normal  Mood:  minimizes depression at this time, and describes her mood as " normal", Presents euthymic   Affect:  Appropriate and Full Range  Thought Process:  Linear and Descriptions of Associations: Intact  Orientation:  Full (Time, Place, and Person)  Thought Content:  no hallucinations,no delusions, not internally preoccupied   Suicidal Thoughts:  No denies any current suicidal or self injurious ideations, denies any homicidal or violent ideations  Homicidal Thoughts:  No  Memory:  recent and remote grossly intact   Judgement:  Other:  improved   Insight:  improved  Psychomotor Activity:  Normal  Concentration:  Good  Recall:  Good  Fund of Knowledge:Good  Language: Good  Akathisia:  Negative  Handed:  Right  AIMS (if indicated):   no abnormal movements noted or reported   Assets:  Desire for Improvement Resilience  Sleep:  Number of Hours: 6.25  Cognition: WNL  ADL's:  Intact   Mental Status Per Nursing Assessment::   On Admission:     Demographic Factors:  21 year old single  female , 21 year old   Loss Factors: Academic , social stressors   Historical Factors: History of self cutting , no prior suicidal attempts, no history of psychosis, no history of prior psychiatric admissions   Risk Reduction Factors:   Sense of responsibility to family, Living with another person, especially a relative and Positive coping skills or problem solving skills  Continued Clinical Symptoms:  At this time patient presents alert, attentive, well related, pleasant, euthymic, with bright affect, denies feeling depressed,no thought disorder, no suicidal or self injurious ideations, no homicidal or violent ideations,no psychotic symptoms, future oriented . Denies medication side effects. Medication side effects reviewed, including risk of metabolic , movement disorders and potential for sedation Behavior on unit in good control, pleasant on approach .  Cognitive Features That Contribute To Risk:  No gross cognitive deficits noted upon discharge. Is alert , attentive, and oriented x 3   Suicide Risk:  Mild:  Suicidal ideation of limited frequency, intensity, duration, and specificity.  There are no identifiable plans, no associated intent, mild dysphoria and related symptoms, good self-control (both objective and subjective assessment), few other risk factors, and identifiable protective factors, including available and accessible social support.  Follow-up Information    [facility] Follow up.   Why:  Your Child psychotherapist will call and schedule a therapy and medication management appointment Contact information: [address] [place], [place] [address] [phone]        as tolerated Plan Of Care/Follow-up recommendations:  Activity:  as tolerated  Diet:  regular Tests:  NA Other:  see below  Patient is requesting discharge and there are no current grounds for involuntary commitment  She is leaving unit in good spirits Plans to return home- lives with roommates,  and plans to return to college classes after discharging. Craige Cotta, MD 01/10/2017, 9:39 AM

## 2017-01-10 NOTE — Progress Notes (Addendum)
  [facility] Adult Case Management Discharge Plan :  Will you be returning to the same living situation after discharge:  Yes,  w boyfriend At discharge, do you have transportation home?: Yes,  boyfriend Do you have the ability to pay for your medications: Yes,  insured, no concerns expressed  Release of information consent forms completed and in the chart;  Patient's signature needed at discharge.  Patient to Follow up at: Follow-up Information    [facility] Follow up.   Why:  Medications management on 10/9 at 8:40 AM and therapy on 10/11 at 2 PM w Dorisann Frames.  Crisis management meeting w Genia Del on 10/23 at 9 AM.   Contact information: [facility]   [address]   [place]  [address] Phone:  [phone] Fax:  [phone]          Next level of care provider has access to [facility] Link:no  Safety Planning and Suicide Prevention discussed: Yes,  reviewed w patient and boyfriend  Have you used any form of tobacco in the last 30 days? (Cigarettes, Smokeless Tobacco, Cigars, and/or Pipes): No  Has patient been referred to the Quitline?: N/A patient is not a smoker  Patient has been referred for addiction treatment: Yes  Sallee Lange, LCSW 01/10/2017, 4:26 PM

## 2017-01-10 NOTE — Tx Team (Signed)
Interdisciplinary Treatment and Diagnostic Plan Update  01/10/2017 Time of Session: 9:30 AM Teresa Braun MRN: [id number]  Principal Diagnosis: Borderline personality disorder (HCC)  Secondary Diagnoses: Principal Problem:   Borderline personality disorder (HCC) Active Problems:   Major depressive disorder, recurrent severe without psychotic features (HCC)   Current Medications:  Current Facility-Administered Medications  Medication Dose Route Frequency Provider Last Rate Last Dose  . acetaminophen (TYLENOL) tablet 650 mg  650 mg Oral Q4H PRN Charm Rings, NP      . alum & mag hydroxide-simeth (MAALOX/MYLANTA) 200-200-20 MG/5ML suspension 30 mL  30 mL Oral Q4H PRN Charm Rings, NP      . ARIPiprazole (ABILIFY) tablet 5 mg  5 mg Oral Daily Money, Gerlene Burdock, FNP   5 mg at 01/09/17 7829  . hydrOXYzine (ATARAX/VISTARIL) tablet 25 mg  25 mg Oral Q6H PRN Nira Conn A, NP      . magnesium hydroxide (MILK OF MAGNESIA) suspension 30 mL  30 mL Oral Daily PRN Charm Rings, NP      . ondansetron (ZOFRAN) tablet 4 mg  4 mg Oral Q8H PRN Charm Rings, NP      . traZODone (DESYREL) tablet 50 mg  50 mg Oral QHS PRN,MR X 1 Nira Conn A, NP       PTA Medications: Prescriptions Prior to Admission  Medication Sig Dispense Refill Last Dose  . doxycycline (VIBRAMYCIN) 100 MG capsule Take 1 capsule (100 mg total) by mouth 2 (two) times daily. (Patient not taking: Reported on 01/07/2017) 20 capsule 0 Completed Course at Unknown time  . ibuprofen (ADVIL,MOTRIN) 800 MG tablet Take 1 tablet (800 mg total) by mouth 3 (three) times daily. (Patient not taking: Reported on 10/11/2015) 30 tablet 0 Not Taking at Unknown time  . ondansetron (ZOFRAN ODT) 4 MG disintegrating tablet Take 1 tablet (4 mg total) by mouth every 8 (eight) hours as needed for nausea. (Patient not taking: Reported on 01/07/2017) 6 tablet 0 Completed Course at Unknown time  . sulfamethoxazole-trimethoprim (BACTRIM DS,SEPTRA DS)  800-160 MG tablet Take 2 tablets by mouth 2 (two) times daily. (Patient not taking: Reported on 01/07/2017) 40 tablet 0 Completed Course at Unknown time  . traMADol (ULTRAM) 50 MG tablet Take 1 tablet (50 mg total) by mouth every 6 (six) hours as needed. (Patient not taking: Reported on 10/11/2015) 15 tablet 0 Not Taking at Unknown time    Patient Stressors: Educational concerns Medication change or noncompliance  Patient Strengths: Ability for insight Active sense of humor Average or above average intelligence Capable of independent living General fund of knowledge Supportive family/friends  Treatment Modalities: Medication Management, Group therapy, Case management,  1 to 1 session with clinician, Psychoeducation, Recreational therapy.   Physician Treatment Plan for Primary Diagnosis: Borderline personality disorder (HCC) Long Term Goal(s): Improvement in symptoms so as ready for discharge Improvement in symptoms so as ready for discharge   Short Term Goals: Ability to demonstrate self-control will improve Ability to identify and develop effective coping behaviors will improve Ability to verbalize feelings will improve Ability to disclose and discuss suicidal ideas  Medication Management: Evaluate patient's response, side effects, and tolerance of medication regimen.  Therapeutic Interventions: 1 to 1 sessions, Unit Group sessions and Medication administration.  Evaluation of Outcomes: Adequate for Discharge  Physician Treatment Plan for Secondary Diagnosis: Principal Problem:   Borderline personality disorder (HCC) Active Problems:   Major depressive disorder, recurrent severe without psychotic features (HCC)  Long Term Goal(s): Improvement  in symptoms so as ready for discharge Improvement in symptoms so as ready for discharge   Short Term Goals: Ability to demonstrate self-control will improve Ability to identify and develop effective coping behaviors will improve Ability  to verbalize feelings will improve Ability to disclose and discuss suicidal ideas     Medication Management: Evaluate patient's response, side effects, and tolerance of medication regimen.  Therapeutic Interventions: 1 to 1 sessions, Unit Group sessions and Medication administration.  Evaluation of Outcomes: Adequate for Discharge   RN Treatment Plan for Primary Diagnosis: Borderline personality disorder (HCC) Long Term Goal(s): Knowledge of disease and therapeutic regimen to maintain health will improve  Short Term Goals: Ability to demonstrate self-control, Ability to participate in decision making will improve and Ability to identify and develop effective coping behaviors will improve  Medication Management: RN will administer medications as ordered by provider, will assess and evaluate patient's response and provide education to patient for prescribed medication. RN will report any adverse and/or side effects to prescribing provider.  Therapeutic Interventions: 1 on 1 counseling sessions, Psychoeducation, Medication administration, Evaluate responses to treatment, Monitor vital signs and CBGs as ordered, Perform/monitor CIWA, COWS, AIMS and Fall Risk screenings as ordered, Perform wound care treatments as ordered.  Evaluation of Outcomes: Adequate for Discharge   LCSW Treatment Plan for Primary Diagnosis: Borderline personality disorder (HCC) Long Term Goal(s): Safe transition to appropriate next level of care at discharge, Engage patient in therapeutic group addressing interpersonal concerns.  Short Term Goals: Engage patient in aftercare planning with referrals and resources, Increase ability to appropriately verbalize feelings, Increase emotional regulation and Identify triggers associated with mental health/substance abuse issues  Therapeutic Interventions: Assess for all discharge needs, 1 to 1 time with Social worker, Explore available resources and support systems, Assess for  adequacy in community support network, Educate family and significant other(s) on suicide prevention, Complete Psychosocial Assessment, Interpersonal group therapy.  Evaluation of Outcomes: Adequate for Discharge   Progress in Treatment: Attending groups: Yes. Participating in groups: Yes. Taking medication as prescribed: Yes. Toleration medication: Yes. Family/Significant other contact made: Yes, individual(s) contacted:  boyfriend Patient understands diagnosis: Yes. Discussing patient identified problems/goals with staff: Yes. Medical problems stabilized or resolved: Yes. Denies suicidal/homicidal ideation: Yes. and As evidenced by:  MD Discharge suicide risk assessment and patient statement Issues/concerns per patient self-inventory: No. Other: NA  New problem(s) identified: No, Describe:  [facility] states she will walk in and call CSW w appointment  New Short Term/Long Term Goal(s):  "increase my coping skills, be open to friends, access support groups"  Discharge Plan or Barriers: return home, follow up outpatient  Reason for Continuation of Hospitalization: Delusions  Medication stabilization Suicidal ideation  Estimated Length of Stay:  3 - 5 days, discharge 10/1  Attendees: Patient: Teresa Braun 01/10/2017 9:08 AM  Physician: Sallyanne Havers MD 01/10/2017 9:08 AM  Nursing: Foy Guadalajara RN 01/10/2017 9:08 AM  RN Care Manager: 01/10/2017 9:08 AM  Social Worker: Governor Rooks LCSW 01/10/2017 9:08 AM  Recreational Therapist:  01/10/2017 9:08 AM  Other:  01/10/2017 9:08 AM  Other:  01/10/2017 9:08 AM  Other: 01/10/2017 9:08 AM    Scribe for Treatment Team: Sallee Lange, LCSW 01/10/2017 9:08 AM

## 2018-05-03 IMAGING — CR DG FOOT COMPLETE 3+V*L*
3 series · 3 of 3 positions shown · non-contrast
Comparison: No recent prior.

CLINICAL DATA: Pain.  No known injury.  Initial evaluation .

EXAM:
LEFT FOOT - COMPLETE 3+ VIEW

[x foot ap left]
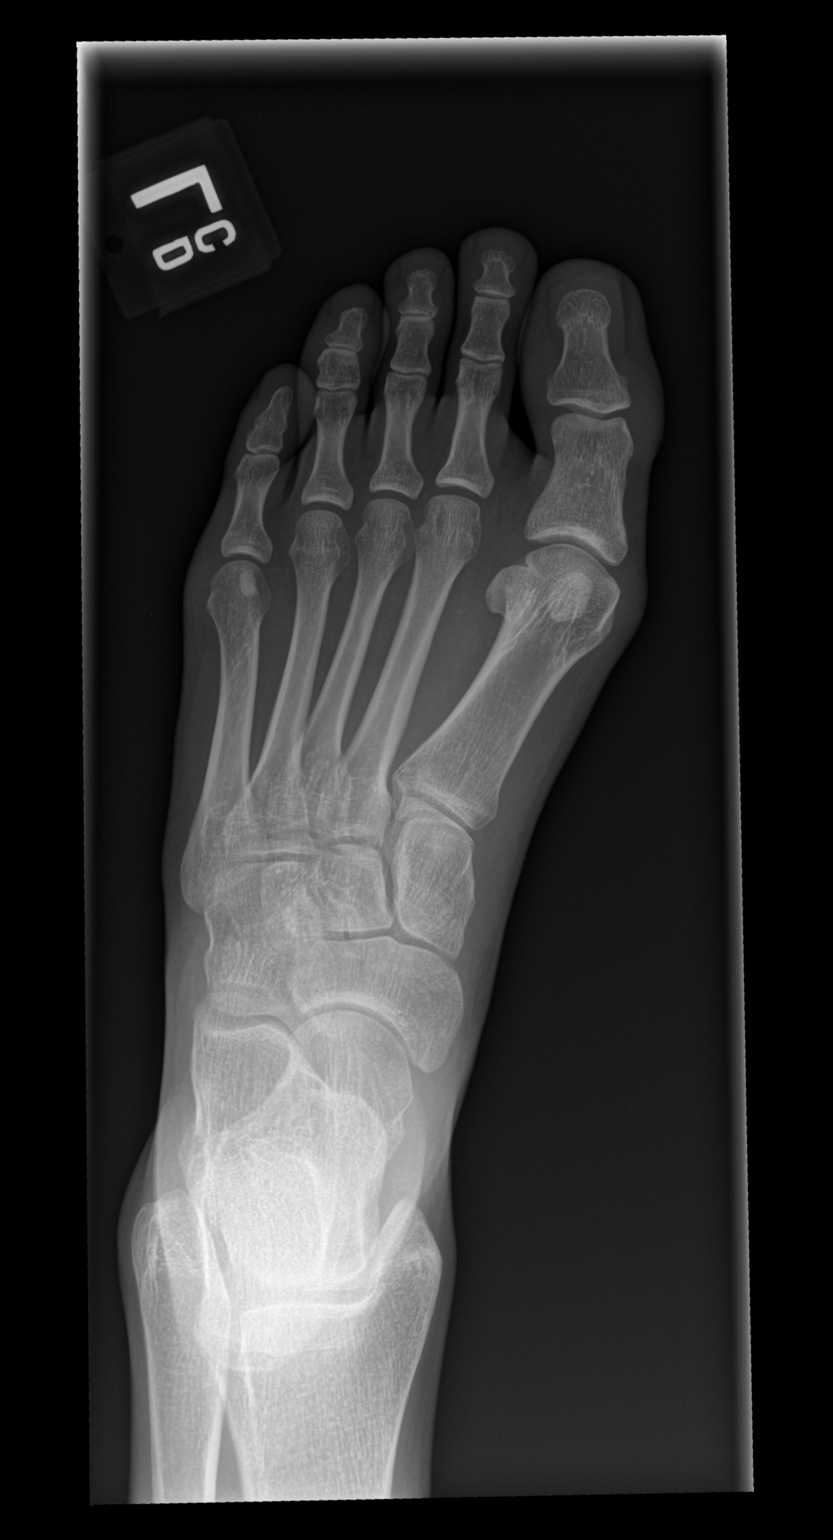

[x foot obl left]
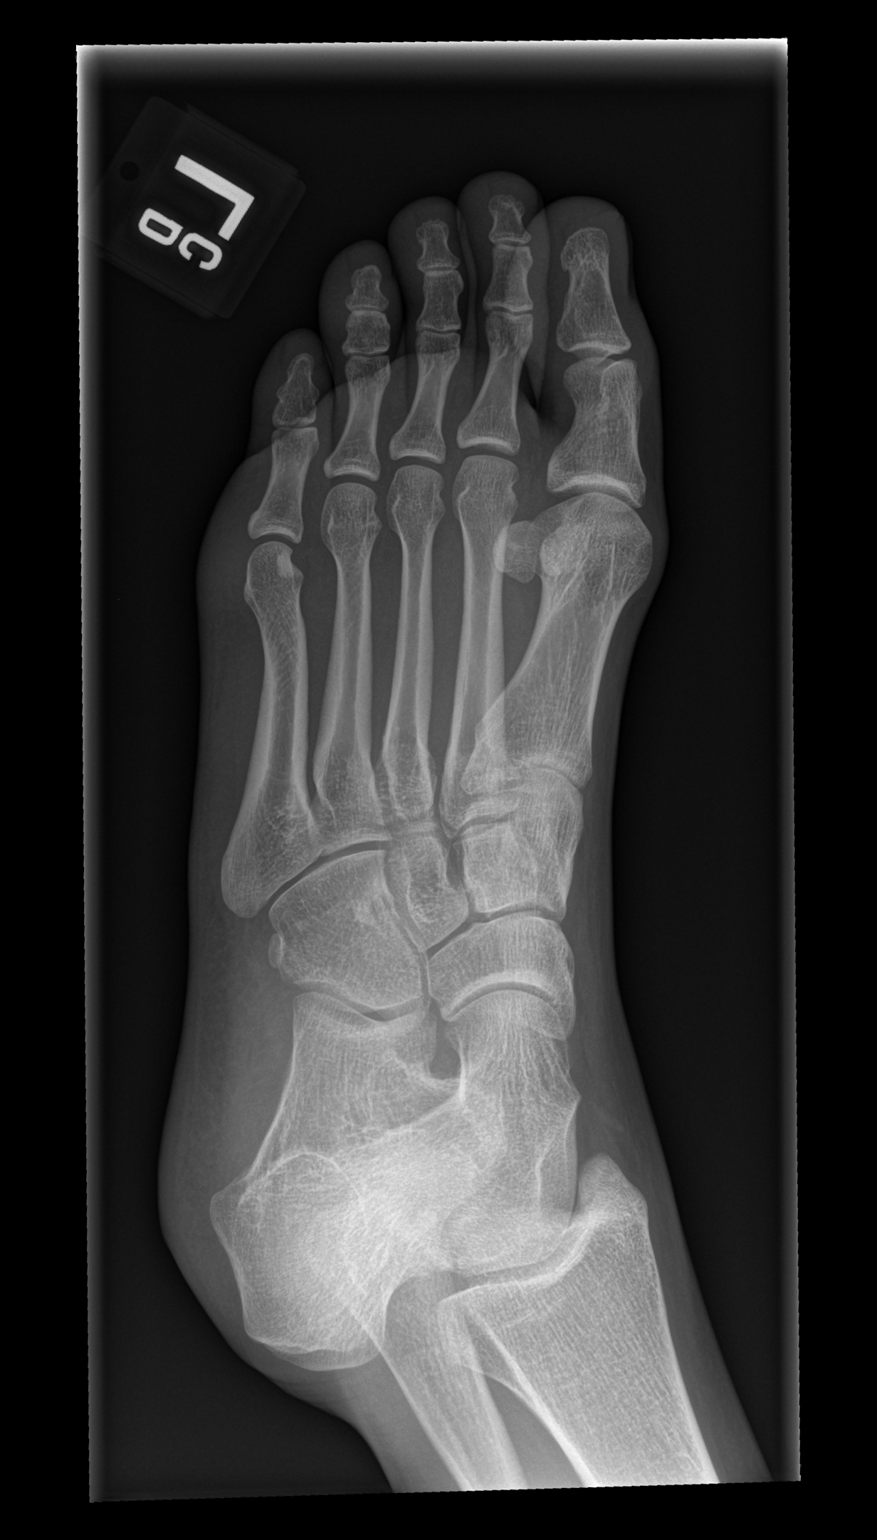

[x foot lat left]
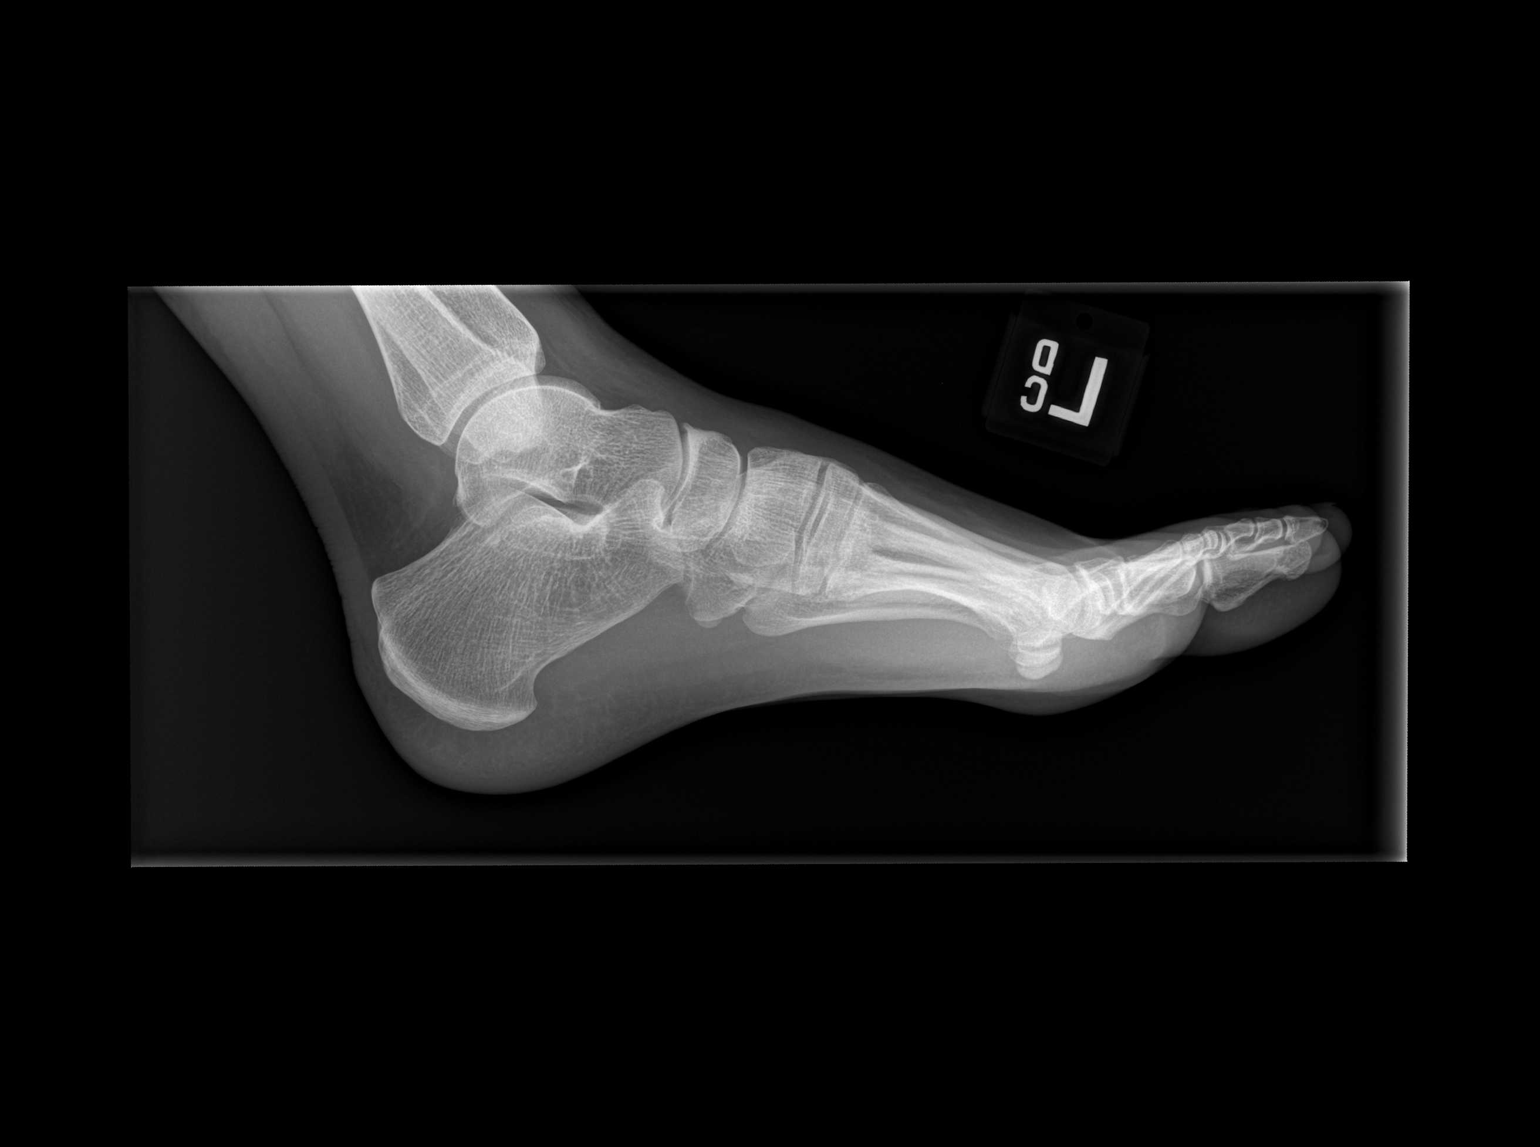

[3 of 3 positions shown; findings below may reference images not displayed]

FINDINGS: No acute bony or joint abnormality identified. No evidence of
fracture or dislocation. Tiny sclerotic focus in the distal aspect
of the left fifth metacarpal. This is most likely a bone island.
IMPRESSION: No acute abnormality.
